# Patient Record
Sex: Male | Born: 1955 | Race: White | Hispanic: No | Smoking: Current some day smoker
Health system: Southern US, Community
[De-identification: ages and names within clinical notes are randomized; demographics above are authoritative.]

## PROBLEM LIST (undated history)

## (undated) DIAGNOSIS — I10 Essential (primary) hypertension: Secondary | ICD-10-CM

## (undated) DIAGNOSIS — G4730 Sleep apnea, unspecified: Secondary | ICD-10-CM

## (undated) DIAGNOSIS — J302 Other seasonal allergic rhinitis: Secondary | ICD-10-CM

## (undated) DIAGNOSIS — F419 Anxiety disorder, unspecified: Secondary | ICD-10-CM

## (undated) DIAGNOSIS — S129XXA Fracture of neck, unspecified, initial encounter: Secondary | ICD-10-CM

## (undated) DIAGNOSIS — M4800 Spinal stenosis, site unspecified: Secondary | ICD-10-CM

## (undated) DIAGNOSIS — F329 Major depressive disorder, single episode, unspecified: Secondary | ICD-10-CM

## (undated) DIAGNOSIS — T7840XA Allergy, unspecified, initial encounter: Secondary | ICD-10-CM

## (undated) DIAGNOSIS — E7800 Pure hypercholesterolemia, unspecified: Secondary | ICD-10-CM

## (undated) DIAGNOSIS — M542 Cervicalgia: Secondary | ICD-10-CM

## (undated) DIAGNOSIS — Z8639 Personal history of other endocrine, nutritional and metabolic disease: Secondary | ICD-10-CM

## (undated) DIAGNOSIS — F32A Depression, unspecified: Secondary | ICD-10-CM

## (undated) DIAGNOSIS — J45909 Unspecified asthma, uncomplicated: Secondary | ICD-10-CM

## (undated) DIAGNOSIS — M1990 Unspecified osteoarthritis, unspecified site: Secondary | ICD-10-CM

## (undated) DIAGNOSIS — M549 Dorsalgia, unspecified: Secondary | ICD-10-CM

## (undated) HISTORY — DX: Spinal stenosis, site unspecified: M48.00

## (undated) HISTORY — DX: Major depressive disorder, single episode, unspecified: F32.9

## (undated) HISTORY — DX: Allergy, unspecified, initial encounter: T78.40XA

## (undated) HISTORY — PX: NASAL SINUS SURGERY: SHX719

## (undated) HISTORY — DX: Anxiety disorder, unspecified: F41.9

## (undated) HISTORY — DX: Other seasonal allergic rhinitis: J30.2

## (undated) HISTORY — DX: Essential (primary) hypertension: I10

## (undated) HISTORY — PX: CARPAL TUNNEL RELEASE: SHX101

## (undated) HISTORY — DX: Depression, unspecified: F32.A

## (undated) HISTORY — DX: Unspecified osteoarthritis, unspecified site: M19.90

## (undated) HISTORY — PX: TONSILLECTOMY: SUR1361

## (undated) HISTORY — DX: Personal history of other endocrine, nutritional and metabolic disease: Z86.39

## (undated) HISTORY — DX: Unspecified asthma, uncomplicated: J45.909

## (undated) HISTORY — DX: Sleep apnea, unspecified: G47.30

---

## 2015-03-29 ENCOUNTER — Encounter (HOSPITAL_COMMUNITY): Payer: Self-pay

## 2015-03-29 ENCOUNTER — Emergency Department (HOSPITAL_COMMUNITY)
Admission: EM | Admit: 2015-03-29 | Discharge: 2015-03-29 | Disposition: A | Discharge status: Home / Self Care | Payer: Medicare (Managed Care) | Attending: Emergency Medicine | Admitting: Emergency Medicine

## 2015-03-29 DIAGNOSIS — D1721 Benign lipomatous neoplasm of skin and subcutaneous tissue of right arm: Secondary | ICD-10-CM | POA: Diagnosis not present

## 2015-03-29 DIAGNOSIS — D1720 Benign lipomatous neoplasm of skin and subcutaneous tissue of unspecified limb: Secondary | ICD-10-CM

## 2015-03-29 DIAGNOSIS — Z8781 Personal history of (healed) traumatic fracture: Secondary | ICD-10-CM | POA: Diagnosis not present

## 2015-03-29 DIAGNOSIS — R200 Anesthesia of skin: Secondary | ICD-10-CM | POA: Insufficient documentation

## 2015-03-29 DIAGNOSIS — I10 Essential (primary) hypertension: Secondary | ICD-10-CM | POA: Diagnosis not present

## 2015-03-29 DIAGNOSIS — Z720 Tobacco use: Secondary | ICD-10-CM | POA: Insufficient documentation

## 2015-03-29 DIAGNOSIS — E780 Pure hypercholesterolemia: Secondary | ICD-10-CM | POA: Diagnosis not present

## 2015-03-29 DIAGNOSIS — G8929 Other chronic pain: Secondary | ICD-10-CM | POA: Diagnosis not present

## 2015-03-29 DIAGNOSIS — Z79899 Other long term (current) drug therapy: Secondary | ICD-10-CM | POA: Insufficient documentation

## 2015-03-29 DIAGNOSIS — R2231 Localized swelling, mass and lump, right upper limb: Secondary | ICD-10-CM | POA: Diagnosis present

## 2015-03-29 HISTORY — DX: Pure hypercholesterolemia, unspecified: E78.00

## 2015-03-29 HISTORY — DX: Fracture of neck, unspecified, initial encounter: S12.9XXA

## 2015-03-29 NOTE — Discharge Instructions (Signed)
Lipoma  A lipoma is a noncancerous (benign) tumor composed of fat cells. They are usually found under the skin (subcutaneous). A lipoma may occur in any tissue of the body that contains fat. Common areas for lipomas to appear include the back, shoulders, buttocks, and thighs. Lipomas are a very common soft tissue growth. They are soft and grow slowly. Most problems caused by a lipoma depend on where it is growing.  DIAGNOSIS   A lipoma can be diagnosed with a physical exam. These tumors rarely become cancerous, but radiographic studies can help determine this for certain. Studies used may include:  · Computerized X-ray scans (CT or CAT scan).  · Computerized magnetic scans (MRI).  TREATMENT   Small lipomas that are not causing problems may be watched. If a lipoma continues to enlarge or causes problems, removal is often the best treatment. Lipomas can also be removed to improve appearance. Surgery is done to remove the fatty cells and the surrounding capsule. Most often, this is done with medicine that numbs the area (local anesthetic). The removed tissue is examined under a microscope to make sure it is not cancerous. Keep all follow-up appointments with your caregiver.  SEEK MEDICAL CARE IF:   · The lipoma becomes larger or hard.  · The lipoma becomes painful, red, or increasingly swollen. These could be signs of infection or a more serious condition.  Document Released: 06/07/2002 Document Revised: 09/09/2011 Document Reviewed: 11/17/2009  ExitCare® Patient Information ©2015 [employer]. This information is not intended to replace advice given to you by your health care provider. Make sure you discuss any questions you have with your health care provider.

## 2015-03-29 NOTE — ED Notes (Signed)
Pt reports noticed a knot on upper r arm x 6 months.  Denies injury.  Reports his pcp told him not to worry about it if it isn't hurting.  Pt says since then has progressively gotten worse and has had some numbness in r arm.

## 2015-03-30 NOTE — ED Provider Notes (Signed)
CSN: [id number]     Arrival date & time 03/29/15  1149 History   First MD Initiated Contact with Patient 03/29/15 1257     Chief Complaint  Patient presents with  . knot on r arm      (Consider location/radiation/quality/duration/timing/severity/associated sxs/prior Treatment) The history is provided by the patient.   David Simon is a 59 y.o. 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  He endorses waking some morning with pain and numbness in the right arm, especially when he wakes on that side, with symptoms resolving after a few minute of moving.  The nodule has enlarged and is now sore with palpation.  His pcp prior to moving to this area diagnosed him with a lipoma.  Pt has concerns about the progression of size and pain since [place]  He denies any other nodules and has had no changes in his chronic neck pain and denies any other symptoms at this time, endorsing good appetite, no unexplained weight loss or other new pain, nodules or swelling.  He has had no treatment for this problem.     Past Medical History  Diagnosis Date  . Hypertension   . Hypercholesterolemia   . Fracture of neck     traumatic event in 2008   Past Surgical History  Procedure Laterality Date  . Carpal tunnel release     No family history on file. Social History  Substance Use Topics  . Smoking status: Current Some Day Smoker    Types: Cigars  . Smokeless tobacco: None  . Alcohol Use: Yes     Comment: 2-3beers per day    Review of Systems  Constitutional: Negative for fever and chills.  HENT: Negative.   Respiratory: Negative for shortness of breath.   Cardiovascular: Negative.   Gastrointestinal: Negative.   Musculoskeletal: Negative for  arthralgias.  Skin: Negative for color change and rash.  Neurological: Negative for weakness and headaches.      Allergies  Other  Home Medications   Prior to Admission medications   Medication Sig Start Date End Date Taking? Authorizing Carmeron Heady  amLODipine-valsartan (EXFORGE) 10-320 MG tablet Take 1 tablet by mouth daily.   Yes Historical Lynleigh Kovack, MD  atorvastatin (LIPITOR) 10 MG tablet Take 10 mg by mouth daily.   Yes Historical Mccall Will, MD  levocetirizine (XYZAL) 5 MG tablet Take 5 mg by mouth every evening.   Yes Historical Dennies Coate, MD  metoprolol (LOPRESSOR) 100 MG tablet Take 100 mg by mouth 2 (two) times daily.   Yes Historical Viki Carrera, MD   BP 148/88 mmHg  Pulse 62  Temp(Src) 98.2 F (36.8 C) (Oral)  Resp 18  Ht 5\' 10"  (1.778 m)  Wt 207 lb (93.895 kg)  BMI 29.70 kg/m2  SpO2 99% Physical Exam  Constitutional: He appears well-developed and well-nourished. No distress.  HENT:  Head: Normocephalic.  Neck: Neck supple.  Cardiovascular: Normal rate.   Pulmonary/Chest: Effort normal. He has no wheezes.  Musculoskeletal: Normal range of motion. He exhibits no edema or tenderness.  Lymphadenopathy:    He has no cervical adenopathy.       Right axillary: No pectoral and no lateral adenopathy present.       Right: No supraclavicular and no  epitrochlear adenopathy present.  Neurological: He exhibits normal muscle tone.  Equal grip strength. Normal sensation in upper extremities, radial pulses full.  Skin:  Approximate 2 cm mobile, soft, rubbery feeling nodule right upper lateral arm.  No erythema or skin changes, skin is intact.      ED Course  Procedures (including critical care time) Labs Review Labs Reviewed - No data to display  Imaging Review No results found. I have personally reviewed and evaluated these images and lab results as part of my medical decision-making.   EKG Interpretation None      MDM   Final diagnoses:  Lipoma of arm    Pt was  also seen by Dr. Reather Converse for pt reassurance.  He was referred to dermatology in the event this lipoma continues to worsen or becomes more problematic. He may benefit from biopsy/excision since it is starting to cause sx for him.  Pt understands and agrees with plan. No clinical evidence for cancerous tumor, infection, abscess.  Most consistent with benign lipoma.    Evalee Jefferson, PA-C 03/30/15 2080  Elnora Morrison, MD 03/30/15 [phone]

## 2015-05-02 ENCOUNTER — Encounter (INDEPENDENT_AMBULATORY_CARE_PROVIDER_SITE_OTHER): Payer: Self-pay

## 2015-05-02 ENCOUNTER — Encounter: Payer: Self-pay | Admitting: Family Medicine

## 2015-05-02 ENCOUNTER — Ambulatory Visit (INDEPENDENT_AMBULATORY_CARE_PROVIDER_SITE_OTHER): Payer: Medicare Other | Admitting: Family Medicine

## 2015-05-02 VITALS — BP 120/70 | HR 79 | Temp 97.1°F | Ht 70.0 in | Wt 202.0 lb

## 2015-05-02 DIAGNOSIS — N529 Male erectile dysfunction, unspecified: Secondary | ICD-10-CM | POA: Diagnosis not present

## 2015-05-02 DIAGNOSIS — N400 Enlarged prostate without lower urinary tract symptoms: Secondary | ICD-10-CM

## 2015-05-02 DIAGNOSIS — E785 Hyperlipidemia, unspecified: Secondary | ICD-10-CM

## 2015-05-02 DIAGNOSIS — I10 Essential (primary) hypertension: Secondary | ICD-10-CM | POA: Diagnosis not present

## 2015-05-02 DIAGNOSIS — J0100 Acute maxillary sinusitis, unspecified: Secondary | ICD-10-CM

## 2015-05-02 MED ORDER — ATORVASTATIN CALCIUM 10 MG PO TABS: 10.0000 mg | ORAL_TABLET | Freq: Every day | ORAL | Status: DC

## 2015-05-02 MED ORDER — PREDNISONE 20 MG PO TABS: 40.0000 mg | ORAL_TABLET | Freq: Every day | ORAL | Status: DC

## 2015-05-02 MED ORDER — METOPROLOL TARTRATE 100 MG PO TABS: 100.0000 mg | ORAL_TABLET | Freq: Two times a day (BID) | ORAL | Status: DC

## 2015-05-02 MED ORDER — NAPROXEN 375 MG PO TABS: 375.0000 mg | ORAL_TABLET | Freq: Two times a day (BID) | ORAL | Status: DC

## 2015-05-02 MED ORDER — DOXYCYCLINE HYCLATE 100 MG PO TABS: 100.0000 mg | ORAL_TABLET | Freq: Two times a day (BID) | ORAL | Status: DC

## 2015-05-02 MED ORDER — LISINOPRIL-HYDROCHLOROTHIAZIDE 20-25 MG PO TABS: 1.0000 | ORAL_TABLET | Freq: Every day | ORAL | Status: DC

## 2015-05-02 MED ORDER — LEVOCETIRIZINE DIHYDROCHLORIDE 5 MG PO TABS: 5.0000 mg | ORAL_TABLET | Freq: Every evening | ORAL | Status: DC

## 2015-05-02 NOTE — Patient Instructions (Signed)
Great to meet you!  I have treated you for a sinus infection today with doxycycline an dprednisone  If you encounter any problems with medicines, please call  Come back in 3-4 weeks for a physical.   Sinusitis, Adult Sinusitis is redness, soreness, and inflammation of the paranasal sinuses. Paranasal sinuses are air pockets within the bones of your face. They are located beneath your eyes, in the middle of your forehead, and above your eyes. In healthy paranasal sinuses, mucus is able to drain out, and air is able to circulate through them by way of your nose. However, when your paranasal sinuses are inflamed, mucus and air can become trapped. This can allow bacteria and other germs to grow and cause infection. Sinusitis can develop quickly and last only a short time (acute) or continue over a long period (chronic). Sinusitis that lasts for more than 12 weeks is considered chronic. CAUSES Causes of sinusitis include:  Allergies.  Structural abnormalities, such as displacement of the cartilage that separates your nostrils (deviated septum), which can decrease the air flow through your nose and sinuses and affect sinus drainage.  Functional abnormalities, such as when the small hairs (cilia) that line your sinuses and help remove mucus do not work properly or are not present. SIGNS AND SYMPTOMS Symptoms of acute and chronic sinusitis are the same. The primary symptoms are pain and pressure around the affected sinuses. Other symptoms include:  Upper toothache.  Earache.  Headache.  Bad breath.  Decreased sense of smell and taste.  A cough, which worsens when you are lying flat.  Fatigue.  Fever.  Thick drainage from your nose, which often is green and may contain pus (purulent).  Swelling and warmth over the affected sinuses. DIAGNOSIS Your health care provider will perform a physical exam. During your exam, your health care provider may perform any of the following to help  determine if you have acute sinusitis or chronic sinusitis:  Look in your nose for signs of abnormal growths in your nostrils (nasal polyps).  Tap over the affected sinus to check for signs of infection.  View the inside of your sinuses using an imaging device that has a light attached (endoscope). If your health care provider suspects that you have chronic sinusitis, one or more of the following tests may be recommended:  Allergy tests.  Nasal culture. A sample of mucus is taken from your nose, sent to a lab, and screened for bacteria.  Nasal cytology. A sample of mucus is taken from your nose and examined by your health care provider to determine if your sinusitis is related to an allergy. TREATMENT Most cases of acute sinusitis are related to a viral infection and will resolve on their own within 10 days. Sometimes, medicines are prescribed to help relieve symptoms of both acute and chronic sinusitis. These may include pain medicines, decongestants, nasal steroid sprays, or saline sprays. However, for sinusitis related to a bacterial infection, your health care provider will prescribe antibiotic medicines. These are medicines that will help kill the bacteria causing the infection. Rarely, sinusitis is caused by a fungal infection. In these cases, your health care provider will prescribe antifungal medicine. For some cases of chronic sinusitis, surgery is needed. Generally, these are cases in which sinusitis recurs more than 3 times per year, despite other treatments. HOME CARE INSTRUCTIONS  Drink plenty of water. Water helps thin the mucus so your sinuses can drain more easily.  Use a humidifier.  Inhale steam 3-4 times a  day (for example, sit in the bathroom with the shower running).  Apply a warm, moist washcloth to your face 3-4 times a day, or as directed by your health care provider.  Use saline nasal sprays to help moisten and clean your sinuses.  Take medicines only as  directed by your health care provider.  If you were prescribed either an antibiotic or antifungal medicine, finish it all even if you start to feel better. SEEK IMMEDIATE MEDICAL CARE IF:  You have increasing pain or severe headaches.  You have nausea, vomiting, or drowsiness.  You have swelling around your face.  You have vision problems.  You have a stiff neck.  You have difficulty breathing.   This information is not intended to replace advice given to you by your health care provider. Make sure you discuss any questions you have with your health care provider.   Document Released: 06/17/2005 Document Revised: 07/08/2014 Document Reviewed: 07/02/2011 Elsevier Interactive Patient Education [employer].

## 2015-05-02 NOTE — Progress Notes (Signed)
   HPI  Patient presents today to establish care and for acute illness.  Patient Reason cough, nasal congestion, ear pain bilaterally, facial pain, and malaise for about 2-1/2 weeks. He took a half course of amoxicillin and improve slightly but then worsened again. He also endorses dyspnea, mild.  He's moved from [place] about 3 months ago and needs refills on several medications. He would like to change his blood pressure medicine due to cost.  Takes Cialis for BPH and erectile dysfunction He has several running medications which he is not attached to but states that they were covered well under his insurance.  PMH: Smoking status noted Past medical, surgical, social, family history reviewed and updated in EMR ROS: Per HPI  Objective: BP 120/70 mmHg  Pulse 79  Temp(Src) 97.1 F (36.2 C) (Oral)  Ht 5\' 10"  (1.778 m)  Wt 202 lb (91.627 kg)  BMI 28.98 kg/m2 Gen: NAD, alert, cooperative with exam HEENT: NCAT, nares clear, TMs normal bilaterally CV: RRR, good S1/S2, no murmur Resp: CTABL, no wheezes, non-labored Ext: No edema, warm Neuro: Alert and oriented, No gross deficits  Assessment and plan:  # Acute maxillary sinusitis Considering that he has used a recent partial course of amoxicillin I will avoid Augmentin for now. Treat with doxycycline, prednisone  # Hypertension Change Exforge to Prinzide BMP today Recommended fasting labs at his physical in one month.   Orders Placed This Encounter  Procedures  . Basic Metabolic Panel    Meds ordered this encounter  Medications  . tadalafil (CIALIS) 5 MG tablet    Sig: Take 5 mg by mouth daily as needed for erectile dysfunction.  . Cholecalciferol (VITAMIN D3) 50000 UNITS CAPS    Sig: Take by mouth.  . DISCONTD: meloxicam (MOBIC) 15 MG tablet    Sig: Take 15 mg by mouth daily.  . metoprolol (LOPRESSOR) 100 MG tablet    Sig: Take 1 tablet (100 mg total) by mouth 2 (two) times daily.    Dispense:  60 tablet   Refill:  2  . naproxen (NAPROSYN) 375 MG tablet    Sig: Take 1 tablet (375 mg total) by mouth 2 (two) times daily with a meal.    Dispense:  60 tablet    Refill:  1  . levocetirizine (XYZAL) 5 MG tablet    Sig: Take 1 tablet (5 mg total) by mouth every evening.    Dispense:  30 tablet    Refill:  3  . atorvastatin (LIPITOR) 10 MG tablet    Sig: Take 1 tablet (10 mg total) by mouth daily.    Dispense:  90 tablet    Refill:  3  . lisinopril-hydrochlorothiazide (PRINZIDE,ZESTORETIC) 20-25 MG tablet    Sig: Take 1 tablet by mouth daily.    Dispense:  90 tablet    Refill:  3  . predniSONE (DELTASONE) 20 MG tablet    Sig: Take 2 tablets (40 mg total) by mouth daily with breakfast.    Dispense:  14 tablet    Refill:  0    David Apple, MD [facility] 05/02/2015, 4:14 PM

## 2015-05-03 LAB — BASIC METABOLIC PANEL
BUN/Creatinine Ratio: 23 — ABNORMAL HIGH (ref 9–20)
BUN: 22 mg/dL (ref 6–24)
CALCIUM: 9.5 mg/dL (ref 8.7–10.2)
CHLORIDE: 100 mmol/L (ref 97–106)
CO2: 20 mmol/L (ref 18–29)
CREATININE: 0.96 mg/dL (ref 0.76–1.27)
GFR, EST AFRICAN AMERICAN: 100 mL/min/{1.73_m2} (ref >59)
GFR, EST NON AFRICAN AMERICAN: 86 mL/min/{1.73_m2} (ref >59)
Glucose: 81 mg/dL (ref 65–99)
POTASSIUM: 3.8 mmol/L (ref 3.5–5.2)
Sodium: 140 mmol/L (ref 136–144)

## 2015-05-04 DIAGNOSIS — D1721 Benign lipomatous neoplasm of skin and subcutaneous tissue of right arm: Secondary | ICD-10-CM | POA: Diagnosis not present

## 2015-05-10 ENCOUNTER — Telehealth: Payer: Self-pay | Admitting: Family Medicine

## 2015-05-10 NOTE — Telephone Encounter (Signed)
Left detailed message stating the generic for that particular medication and we don't have any samples but to CB with any further questions or concerns.

## 2015-05-17 ENCOUNTER — Other Ambulatory Visit: Payer: Self-pay | Admitting: Family Medicine

## 2015-05-18 MED ORDER — VENLAFAXINE HCL 75 MG PO TABS: 75.0000 mg | ORAL_TABLET | Freq: Two times a day (BID) | ORAL | Status: DC

## 2015-05-18 NOTE — Addendum Note (Signed)
Addended by: Marylin Crosby on: 05/18/2015 10:02 AM   Modules accepted: Orders

## 2015-05-18 NOTE — Telephone Encounter (Signed)
Stp and he agrees to try effexor, rx sent to pharmacy.

## 2015-05-18 NOTE — Telephone Encounter (Signed)
I have changed exforge to prinzide for cost (on 4 dollar list) and have sent that in 11/1. He should stop exforge and take prinzide instead (lisinopril/HCTZ)  Cymbalta could be changed to effexor  But I dont see this on his list.   Will ask nursing to confirm cymbalta and recommend effexor 75 BID instead.   Laroy Apple, MD [facility] 05/18/2015, 7:42 AM

## 2015-05-22 ENCOUNTER — Telehealth: Payer: Self-pay | Admitting: Family Medicine

## 2015-05-22 NOTE — Telephone Encounter (Signed)
Recent RX for BP med Pt just wanted to make sure he could take this with Metoprolol   He was on exforge and metoprolol Aware that meds are correct and to monitor BP more closely since the recent change.

## 2015-05-29 ENCOUNTER — Telehealth: Payer: Self-pay | Admitting: Family Medicine

## 2015-05-29 NOTE — Telephone Encounter (Signed)
Patient states that he is having spasms in his leg since starting the atorvastatin. Please advise.

## 2015-06-07 ENCOUNTER — Ambulatory Visit (INDEPENDENT_AMBULATORY_CARE_PROVIDER_SITE_OTHER): Payer: Medicare Other | Admitting: Family Medicine

## 2015-06-07 ENCOUNTER — Encounter: Payer: Self-pay | Admitting: Family Medicine

## 2015-06-07 VITALS — BP 139/88 | HR 66 | Temp 96.7°F | Ht 70.0 in | Wt 210.0 lb

## 2015-06-07 DIAGNOSIS — J019 Acute sinusitis, unspecified: Secondary | ICD-10-CM | POA: Diagnosis not present

## 2015-06-07 DIAGNOSIS — I10 Essential (primary) hypertension: Secondary | ICD-10-CM

## 2015-06-07 DIAGNOSIS — Z1211 Encounter for screening for malignant neoplasm of colon: Secondary | ICD-10-CM

## 2015-06-07 DIAGNOSIS — E785 Hyperlipidemia, unspecified: Secondary | ICD-10-CM

## 2015-06-07 DIAGNOSIS — J0100 Acute maxillary sinusitis, unspecified: Secondary | ICD-10-CM | POA: Diagnosis not present

## 2015-06-07 DIAGNOSIS — N400 Enlarged prostate without lower urinary tract symptoms: Secondary | ICD-10-CM

## 2015-06-07 DIAGNOSIS — Z0000 Encounter for general adult medical examination without abnormal findings: Secondary | ICD-10-CM | POA: Diagnosis not present

## 2015-06-07 MED ORDER — TRIAMCINOLONE ACETONIDE 40 MG/ML IJ SUSP
40.0000 mg | Freq: Once | INTRAMUSCULAR | Status: AC
  Administered 2015-06-07: 40 mg via INTRAMUSCULAR

## 2015-06-07 MED ORDER — AZITHROMYCIN 250 MG PO TABS: ORAL_TABLET | ORAL | Status: DC

## 2015-06-07 MED ORDER — ROSUVASTATIN CALCIUM 5 MG PO TABS: 5.0000 mg | ORAL_TABLET | Freq: Every day | ORAL | Status: DC

## 2015-06-07 MED ORDER — SILDENAFIL CITRATE 20 MG PO TABS: ORAL_TABLET | ORAL | Status: DC

## 2015-06-07 MED ORDER — TADALAFIL 5 MG PO TABS: 5.0000 mg | ORAL_TABLET | Freq: Every day | ORAL | Status: DC | PRN

## 2015-06-07 MED ORDER — METOPROLOL TARTRATE 100 MG PO TABS: 100.0000 mg | ORAL_TABLET | Freq: Two times a day (BID) | ORAL | Status: DC

## 2015-06-07 NOTE — Progress Notes (Signed)
   HPI  Patient presents today here for an annual physical and to discuss a few medical problems.  Hypertension Doing well, tolerating meds easily. He's out of metoprolol No chest pain, and dyspnea, palpitations, or leg edema.  Acute illness 7-9 days of bilateral sinus pressure, cough, malaise, subjective fever, and sore throat. Tolerating food and fluid easily. No dyspnea No chest pain.  Hyperlipidemia He took Lipitor and developed myalgias, he stopped for a few weeks and then tried again causing myalgias again Fasting today.  BPH Has difficulty with weak stream, urinary frequency Previously did well with Cialis which also helped his ED. Cialis is not covered by his insurance and cost about $300 a month  PMH: Smoking status noted His past medical, surgical, social, family history reviewed and updated in EMR ROS: Per HPI  Objective: BP 139/88 mmHg  Pulse 66  Temp(Src) 96.7 F (35.9 C) (Oral)  Ht _0  (1.778 m)  Wt 210 lb (95.255 kg)  BMI 30.13 kg/m2 Gen: NAD, alert, cooperative with exam HEENT: NCAT, nares clear, TMs normal bilaterally, tonsils surgically absent, MMM, bilateral maxillary sinus pressure to palpation CV: RRR, good S1/S2, no murmur Resp: CTABL, no wheezes, non-labored Ext: No edema, warm Neuro: Alert and oriented, No gross deficits  Assessment and plan:  # Acute sinusitis Recurrent episode, previously improved completely with antibiotics and steroids. He requests IM Kenalog and today, we discussed the dangers of frequent steroids Given 7-9 days of symptoms and facial pain and I think it is prudent to go ahead and treat for acute sinusitis Previously given doxycycline, using azithromycin this time Continue Flonase or neti pot  # Hypertension Reasonably well controlled, he is off of metoprolol and still controlled today No red flags Continue current meds, Prinzide and metoprolol  # Healthcare maintenance Fecal occult blood test negative  today Fasting labs  # Hyperlipidemia Cannot tolerate Lipitor, trying Crestor  #BPH He declines medications for this today, DRE is reassuring  #Erectile dysfunction Given 20 mg sildenafil due to cost, also given Cialis samples    Orders Placed This Encounter  Procedures  . PSA  . CBC with Differential  . CMP14+EGFR  . Lipid Panel    Meds ordered this encounter  Medications  . tadalafil (CIALIS) 5 MG tablet    Sig: Take 1 tablet (5 mg total) by mouth daily as needed for erectile dysfunction.    Dispense:  10 tablet    Refill:  0  . sildenafil (REVATIO) 20 MG tablet    Sig: Take 2 to 5 pills once daily for erectile dysfunction    Dispense:  30 tablet    Refill:  3  . metoprolol (LOPRESSOR) 100 MG tablet    Sig: Take 1 tablet (100 mg total) by mouth 2 (two) times daily.    Dispense:  60 tablet    Refill:  2  . rosuvastatin (CRESTOR) 5 MG tablet    Sig: Take 1 tablet (5 mg total) by mouth daily.    Dispense:  90 tablet    Refill:  3  . azithromycin (ZITHROMAX Z-PAK) 250 MG tablet    Sig: 2 pills on day 1, 1 pill daily after that    Dispense:  6 tablet    Refill:  0  . triamcinolone acetonide (KENALOG-40) injection 40 mg    Sig:     Laroy Apple, MD [facility] 06/07/2015, 12:07 PM

## 2015-06-07 NOTE — Patient Instructions (Addendum)
Great to see you!  Lets follow up in 6 months  Sinusitis, Adult Sinusitis is redness, soreness, and inflammation of the paranasal sinuses. Paranasal sinuses are air pockets within the bones of your face. They are located beneath your eyes, in the middle of your forehead, and above your eyes. In healthy paranasal sinuses, mucus is able to drain out, and air is able to circulate through them by way of your nose. However, when your paranasal sinuses are inflamed, mucus and air can become trapped. This can allow bacteria and other germs to grow and cause infection. Sinusitis can develop quickly and last only a short time (acute) or continue over a long period (chronic). Sinusitis that lasts for more than 12 weeks is considered chronic. CAUSES Causes of sinusitis include:  Allergies.  Structural abnormalities, such as displacement of the cartilage that separates your nostrils (deviated septum), which can decrease the air flow through your nose and sinuses and affect sinus drainage.  Functional abnormalities, such as when the small hairs (cilia) that line your sinuses and help remove mucus do not work properly or are not present. SIGNS AND SYMPTOMS Symptoms of acute and chronic sinusitis are the same. The primary symptoms are pain and pressure around the affected sinuses. Other symptoms include:  Upper toothache.  Earache.  Headache.  Bad breath.  Decreased sense of smell and taste.  A cough, which worsens when you are lying flat.  Fatigue.  Fever.  Thick drainage from your nose, which often is green and may contain pus (purulent).  Swelling and warmth over the affected sinuses. DIAGNOSIS Your health care provider will perform a physical exam. During your exam, your health care provider may perform any of the following to help determine if you have acute sinusitis or chronic sinusitis:  Look in your nose for signs of abnormal growths in your nostrils (nasal polyps).  Tap over the  affected sinus to check for signs of infection.  View the inside of your sinuses using an imaging device that has a light attached (endoscope). If your health care provider suspects that you have chronic sinusitis, one or more of the following tests may be recommended:  Allergy tests.  Nasal culture. A sample of mucus is taken from your nose, sent to a lab, and screened for bacteria.  Nasal cytology. A sample of mucus is taken from your nose and examined by your health care provider to determine if your sinusitis is related to an allergy. TREATMENT Most cases of acute sinusitis are related to a viral infection and will resolve on their own within 10 days. Sometimes, medicines are prescribed to help relieve symptoms of both acute and chronic sinusitis. These may include pain medicines, decongestants, nasal steroid sprays, or saline sprays. However, for sinusitis related to a bacterial infection, your health care provider will prescribe antibiotic medicines. These are medicines that will help kill the bacteria causing the infection. Rarely, sinusitis is caused by a fungal infection. In these cases, your health care provider will prescribe antifungal medicine. For some cases of chronic sinusitis, surgery is needed. Generally, these are cases in which sinusitis recurs more than 3 times per year, despite other treatments. HOME CARE INSTRUCTIONS  Drink plenty of water. Water helps thin the mucus so your sinuses can drain more easily.  Use a humidifier.  Inhale steam 3-4 times a day (for example, sit in the bathroom with the shower running).  Apply a warm, moist washcloth to your face 3-4 times a day, or as directed  by your health care provider.  Use saline nasal sprays to help moisten and clean your sinuses.  Take medicines only as directed by your health care provider.  If you were prescribed either an antibiotic or antifungal medicine, finish it all even if you start to feel better. SEEK  IMMEDIATE MEDICAL CARE IF:  You have increasing pain or severe headaches.  You have nausea, vomiting, or drowsiness.  You have swelling around your face.  You have vision problems.  You have a stiff neck.  You have difficulty breathing.   This information is not intended to replace advice given to you by your health care provider. Make sure you discuss any questions you have with your health care provider.   Document Released: 06/17/2005 Document Revised: 07/08/2014 Document Reviewed: 07/02/2011 Elsevier Interactive Patient Education [employer].

## 2015-06-08 ENCOUNTER — Other Ambulatory Visit: Payer: Self-pay | Admitting: *Deleted

## 2015-06-08 ENCOUNTER — Telehealth: Payer: Self-pay | Admitting: Family Medicine

## 2015-06-08 DIAGNOSIS — Z1211 Encounter for screening for malignant neoplasm of colon: Secondary | ICD-10-CM | POA: Insufficient documentation

## 2015-06-08 LAB — LIPID PANEL
CHOL/HDL RATIO: 3.8 ratio (ref 0.0–5.0)
Cholesterol, Total: 194 mg/dL (ref 100–199)
HDL: 51 mg/dL (ref >39)
LDL CALC: 109 mg/dL — AB (ref 0–99)
Triglycerides: 172 mg/dL — ABNORMAL HIGH (ref 0–149)
VLDL CHOLESTEROL CAL: 34 mg/dL (ref 5–40)

## 2015-06-08 LAB — CMP14+EGFR
ALBUMIN: 4.2 g/dL (ref 3.5–5.5)
ALT: 13 IU/L (ref 0–44)
AST: 26 IU/L (ref 0–40)
Albumin/Globulin Ratio: 2.1 (ref 1.1–2.5)
Alkaline Phosphatase: 56 IU/L (ref 39–117)
BUN/Creatinine Ratio: 20 (ref 9–20)
BUN: 18 mg/dL (ref 6–24)
Bilirubin Total: 0.2 mg/dL (ref 0.0–1.2)
CALCIUM: 9.5 mg/dL (ref 8.7–10.2)
CHLORIDE: 97 mmol/L (ref 97–106)
CO2: 23 mmol/L (ref 18–29)
CREATININE: 0.9 mg/dL (ref 0.76–1.27)
GFR, EST AFRICAN AMERICAN: 108 mL/min/{1.73_m2} (ref >59)
GFR, EST NON AFRICAN AMERICAN: 93 mL/min/{1.73_m2} (ref >59)
GLUCOSE: 91 mg/dL (ref 65–99)
Globulin, Total: 2 g/dL (ref 1.5–4.5)
Potassium: 4.2 mmol/L (ref 3.5–5.2)
Sodium: 136 mmol/L (ref 136–144)
TOTAL PROTEIN: 6.2 g/dL (ref 6.0–8.5)

## 2015-06-08 LAB — HEMOCCULT GUIAC POC 1CARD (OFFICE): Fecal Occult Blood, POC: NEGATIVE

## 2015-06-08 LAB — CBC WITH DIFFERENTIAL/PLATELET
BASOS ABS: 0.1 10*3/uL (ref 0.0–0.2)
Basos: 1 %
EOS (ABSOLUTE): 0.1 10*3/uL (ref 0.0–0.4)
Eos: 2 %
HEMOGLOBIN: 13.4 g/dL (ref 12.6–17.7)
Hematocrit: 38.9 % (ref 37.5–51.0)
IMMATURE GRANS (ABS): 0 10*3/uL (ref 0.0–0.1)
IMMATURE GRANULOCYTES: 1 %
LYMPHS: 29 %
Lymphocytes Absolute: 1.8 10*3/uL (ref 0.7–3.1)
MCH: 30.4 pg (ref 26.6–33.0)
MCHC: 34.4 g/dL (ref 31.5–35.7)
MCV: 88 fL (ref 79–97)
MONOCYTES: 11 %
Monocytes Absolute: 0.7 10*3/uL (ref 0.1–0.9)
NEUTROS PCT: 56 %
Neutrophils Absolute: 3.6 10*3/uL (ref 1.4–7.0)
PLATELETS: 282 10*3/uL (ref 150–379)
RBC: 4.41 x10E6/uL (ref 4.14–5.80)
RDW: 13.3 % (ref 12.3–15.4)
WBC: 6.2 10*3/uL (ref 3.4–10.8)

## 2015-06-08 LAB — IFOBT (OCCULT BLOOD): IMMUNOLOGICAL FECAL OCCULT BLOOD TEST: NEGATIVE

## 2015-06-08 LAB — PSA: PROSTATE SPECIFIC AG, SERUM: 0.9 ng/mL (ref 0.0–4.0)

## 2015-06-08 NOTE — Telephone Encounter (Signed)
Reviewed results. 

## 2015-06-08 NOTE — Addendum Note (Signed)
Addended by: Ilean China on: 06/08/2015 02:25 PM   Modules accepted: Orders

## 2015-06-08 NOTE — Addendum Note (Signed)
Addended by: Timmothy Euler on: 06/08/2015 02:04 PM   Modules accepted: Orders, SmartSet

## 2015-06-12 ENCOUNTER — Other Ambulatory Visit: Payer: Self-pay | Admitting: Family Medicine

## 2015-06-12 MED ORDER — DULOXETINE HCL 60 MG PO CPEP: 60.0000 mg | ORAL_CAPSULE | Freq: Every day | ORAL | Status: DC

## 2015-06-12 NOTE — Telephone Encounter (Signed)
Change effexor to [place], MD [facility] 06/12/2015, 3:08 PM

## 2015-07-20 ENCOUNTER — Telehealth: Payer: Self-pay | Admitting: Family Medicine

## 2015-07-20 NOTE — Telephone Encounter (Signed)
Any local therapist would be good. He can use [email or web address] to enter his insurance and area of choice for a list.   Laroy Apple, MD [facility] 07/20/2015, 5:25 PM

## 2015-07-21 NOTE — Telephone Encounter (Signed)
Patient aware and I also mailed him a list.

## 2015-07-24 ENCOUNTER — Ambulatory Visit (INDEPENDENT_AMBULATORY_CARE_PROVIDER_SITE_OTHER): Payer: Medicare Other | Admitting: Family Medicine

## 2015-07-24 ENCOUNTER — Ambulatory Visit (INDEPENDENT_AMBULATORY_CARE_PROVIDER_SITE_OTHER): Payer: Medicare Other

## 2015-07-24 ENCOUNTER — Encounter: Payer: Self-pay | Admitting: Family Medicine

## 2015-07-24 VITALS — BP 131/69 | HR 62 | Temp 97.0°F | Ht 70.0 in | Wt 210.6 lb

## 2015-07-24 DIAGNOSIS — J0101 Acute recurrent maxillary sinusitis: Secondary | ICD-10-CM | POA: Diagnosis not present

## 2015-07-24 DIAGNOSIS — M25511 Pain in right shoulder: Secondary | ICD-10-CM

## 2015-07-24 MED ORDER — TRIAMCINOLONE ACETONIDE 40 MG/ML IJ SUSP
40.0000 mg | Freq: Once | INTRAMUSCULAR | Status: AC
  Administered 2015-07-24: 40 mg via INTRAMUSCULAR

## 2015-07-24 MED ORDER — SILDENAFIL CITRATE 20 MG PO TABS: ORAL_TABLET | ORAL | Status: DC

## 2015-07-24 MED ORDER — MOMETASONE FUROATE 50 MCG/ACT NA SUSP: 2.0000 | Freq: Every day | NASAL | Status: DC

## 2015-07-24 NOTE — Progress Notes (Signed)
HPI  Patient presents today here for right shoulder pain, erectile dysfunction, and sinusitis.  Right shoulder pain Insidious onset over the last 1 month. No injury Has history of left-sided rotator cuff injury but no right shoulder injury. States that he's developed a popping when moving his arm in certain positions He also has right hand numbness intermittently but no loss of function or weakness.  He also has lumbar back pain, he shows me the MRI today which we will have scanned in. He is previously had lumbar epidural injections with with some good relief, he would like to have this done again.  Erectile dysfunction Doing well with 80 mg of sildenafil, would like a refill.  Sinusitis Bilateral maxillary sinus tenderness and pressure, with nasal congestion, and mild malaise over the last 7 days. No shortness of breath, cough, chills. Tolerating food and fluids easily.    PMH: Smoking status noted ROS: Per HPI  Objective: BP 131/69 mmHg  Pulse 62  Temp(Src) 97 F (36.1 C) (Oral)  Ht 5\' 10"  (1.778 m)  Wt 210 lb 9.6 oz (95.528 kg)  BMI 30.22 kg/m2 Gen: NAD, alert, cooperative with exam HEENT: NCAT, bilateral maxillary sinus tenderness to palpation, TMs normal bilaterally CV: RRR, good S1/S2, no murmur Resp: CTABL, no wheezes, non-labored Ext: No edema, warm Neuro: Alert and oriented, strength 5/5 in bilateral upper extremities  Musculoskeletal: Right shoulder with full range of motion except for limited range of motion with Apley scratch test Some crepitus with range of motion No tenderness to palpation of any landmarks. Hawkins and empty can test negative Strength 5/5 bilateral upper extremities  Neck: No bony or paraspinal muscle tenderness, full range of motion with no reproduction of symptoms   Shoulder x-ray No acute findings  Cervical spine x-ray Loss of disc space on C4 and C5 and spondylolisthesis at the same area  Assessment and plan:  #  Shoulder pain With radiculopathy and more suspicious of neck pathology X-ray today shows pathology around C4-C5 disc space  Referred orthopedic surgery, he will also benefit from epidural injections the lumbar spine  # Erectile Dysfunction Doing well with sildenafil 80 mg Refill  # Sinusitis Recent course of azithromycin He had good improvement with steroids about 6 weeks ago and request this again IM Kenalog given Give 2-3 days for symptoms resolve otherwise they'll gladly prescribe him antibiotics, consider Omnicef or Augmentin   Orders Placed This Encounter  Procedures  . DG Shoulder Right    Standing Status: Future     Number of Occurrences:      Standing Expiration Date: 09/20/2016    Order Specific Question:  Reason for Exam (SYMPTOM  OR DIAGNOSIS REQUIRED)    Answer:  r shoulder pain with radiculopathy    Order Specific Question:  Preferred imaging location?    Answer:  Internal  . DG Cervical Spine Complete    Standing Status: Future     Number of Occurrences:      Standing Expiration Date: 09/20/2016    Order Specific Question:  Reason for Exam (SYMPTOM  OR DIAGNOSIS REQUIRED)    Answer:  r shoulder pain with radiculopathy    Order Specific Question:  Preferred imaging location?    Answer:  Internal  . Ambulatory referral to Orthopedic Surgery    Referral Priority:  Routine    Referral Type:  Surgical    Referral Reason:  Specialty Services Required    Requested Specialty:  Orthopedic Surgery    Number of Visits Requested:  1    Meds ordered this encounter  Medications  . sildenafil (REVATIO) 20 MG tablet    Sig: Take 2 to 5 pills once daily for erectile dysfunction    Dispense:  30 tablet    Refill:  3  . mometasone (NASONEX) 50 MCG/ACT nasal spray    Sig: Place 2 sprays into the nose daily.    Dispense:  17 g    Refill:  [place], MD [facility] 07/24/2015, 4:19 PM

## 2015-07-24 NOTE — Addendum Note (Signed)
Addended by: Karle Plumber on: 07/24/2015 05:49 PM   Modules accepted: Orders

## 2015-07-24 NOTE — Patient Instructions (Signed)
Great to see you!  Call in 2-4 days if your sinuses aren't getting better, we should take at least a 4 month break from steroid shots now.   We will arrange an orthapedic referral.   We will call with x ray results

## 2015-08-01 ENCOUNTER — Telehealth: Payer: Self-pay | Admitting: Family Medicine

## 2015-08-01 MED ORDER — AMOXICILLIN-POT CLAVULANATE 875-125 MG PO TABS: 1.0000 | ORAL_TABLET | Freq: Two times a day (BID) | ORAL | Status: DC

## 2015-08-01 NOTE — Telephone Encounter (Signed)
Discussed with patient at visit.   Augmentin.   Laroy Apple, MD [facility] 08/01/2015, 5:08 PM

## 2015-08-02 NOTE — Telephone Encounter (Signed)
Detailed message left for patient.

## 2015-08-03 ENCOUNTER — Telehealth: Payer: Self-pay | Admitting: Family Medicine

## 2015-08-03 ENCOUNTER — Telehealth: Payer: Self-pay

## 2015-08-03 MED ORDER — CEFDINIR 300 MG PO CAPS: 300.0000 mg | ORAL_CAPSULE | Freq: Two times a day (BID) | ORAL | Status: DC

## 2015-08-03 MED ORDER — TADALAFIL 20 MG PO TABS: 20.0000 mg | ORAL_TABLET | Freq: Every day | ORAL | Status: DC | PRN

## 2015-08-03 NOTE — Telephone Encounter (Signed)
Left detailed message that antibiotic was changed and the other requested rx was sent to pharmacy and to [facility] with any further questions or concerns.

## 2015-08-03 NOTE — Telephone Encounter (Signed)
I have changed Augmentin to Omnicef.  I also sent the [place], MD [facility] 08/03/2015, 4:39 PM

## 2015-08-03 NOTE — Telephone Encounter (Signed)
cialis sent.

## 2015-08-03 NOTE — Telephone Encounter (Signed)
Insurance denied Sildenafil due to drugs used for treatment of erectile dysfunction are excluded from coverage under Medicare

## 2015-08-09 ENCOUNTER — Other Ambulatory Visit: Payer: Self-pay

## 2015-08-09 MED ORDER — LEVOCETIRIZINE DIHYDROCHLORIDE 5 MG PO TABS: 5.0000 mg | ORAL_TABLET | Freq: Every evening | ORAL | Status: DC

## 2015-08-10 ENCOUNTER — Ambulatory Visit (INDEPENDENT_AMBULATORY_CARE_PROVIDER_SITE_OTHER): Payer: Medicare Other | Admitting: Family Medicine

## 2015-08-10 ENCOUNTER — Encounter: Payer: Self-pay | Admitting: Family Medicine

## 2015-08-10 VITALS — BP 151/93 | HR 71 | Temp 97.0°F | Ht 70.0 in | Wt 209.5 lb

## 2015-08-10 DIAGNOSIS — I10 Essential (primary) hypertension: Secondary | ICD-10-CM | POA: Diagnosis not present

## 2015-08-10 DIAGNOSIS — E785 Hyperlipidemia, unspecified: Secondary | ICD-10-CM | POA: Diagnosis not present

## 2015-08-10 DIAGNOSIS — J381 Polyp of vocal cord and larynx: Secondary | ICD-10-CM

## 2015-08-10 DIAGNOSIS — R0789 Other chest pain: Secondary | ICD-10-CM

## 2015-08-10 DIAGNOSIS — Z0000 Encounter for general adult medical examination without abnormal findings: Secondary | ICD-10-CM

## 2015-08-10 DIAGNOSIS — J320 Chronic maxillary sinusitis: Secondary | ICD-10-CM

## 2015-08-10 MED ORDER — TRAZODONE HCL 100 MG PO TABS: 50.0000 mg | ORAL_TABLET | Freq: Every evening | ORAL | Status: DC | PRN

## 2015-08-10 NOTE — Patient Instructions (Addendum)
Great to see you!  We will call within a week with results  Continue crestor Try a baby aspirin  You will be called to arrange Cardiology and ENT appointments  Nonspecific Chest Pain  Chest pain can be caused by many different conditions. There is always a chance that your pain could be related to something serious, such as a heart attack or a blood clot in your lungs. Chest pain can also be caused by conditions that are not life-threatening. If you have chest pain, it is very important to follow up with your health care provider. CAUSES  Chest pain can be caused by:  Heartburn.  Pneumonia or bronchitis.  Anxiety or stress.  Inflammation around your heart (pericarditis) or lung (pleuritis or pleurisy).  A blood clot in your lung.  A collapsed lung (pneumothorax). It can develop suddenly on its own (spontaneous pneumothorax) or from trauma to the chest.  Shingles infection (varicella-zoster virus).  Heart attack.  Damage to the bones, muscles, and cartilage that make up your chest wall. This can include:  Bruised bones due to injury.  Strained muscles or cartilage due to frequent or repeated coughing or overwork.  Fracture to one or more ribs.  Sore cartilage due to inflammation (costochondritis). RISK FACTORS  Risk factors for chest pain may include:  Activities that increase your risk for trauma or injury to your chest.  Respiratory infections or conditions that cause frequent coughing.  Medical conditions or overeating that can cause heartburn.  Heart disease or family history of heart disease.  Conditions or health behaviors that increase your risk of developing a blood clot.  Having had chicken pox (varicella zoster). SIGNS AND SYMPTOMS Chest pain can feel like:  Burning or tingling on the surface of your chest or deep in your chest.  Crushing, pressure, aching, or squeezing pain.  Dull or sharp pain that is worse when you move, cough, or take a deep  breath.  Pain that is also felt in your back, neck, shoulder, or arm, or pain that spreads to any of these areas. Your chest pain may come and go, or it may stay constant. DIAGNOSIS Lab tests or other studies may be needed to find the cause of your pain. Your health care provider may have you take a test called an ambulatory ECG (electrocardiogram). An ECG records your heartbeat patterns at the time the test is performed. You may also have other tests, such as:  Transthoracic echocardiogram (TTE). During echocardiography, sound waves are used to create a picture of all of the heart structures and to look at how blood flows through your heart.  Transesophageal echocardiogram (TEE).This is a more advanced imaging test that obtains images from inside your body. It allows your health care provider to see your heart in finer detail.  Cardiac monitoring. This allows your health care provider to monitor your heart rate and rhythm in real time.  Holter monitor. This is a portable device that records your heartbeat and can help to diagnose abnormal heartbeats. It allows your health care provider to track your heart activity for several days, if needed.  Stress tests. These can be done through exercise or by taking medicine that makes your heart beat more quickly.  Blood tests.  Imaging tests. TREATMENT  Your treatment depends on what is causing your chest pain. Treatment may include:  Medicines. These may include:  Acid blockers for heartburn.  Anti-inflammatory medicine.  Pain medicine for inflammatory conditions.  Antibiotic medicine, if an infection is  present.  Medicines to dissolve blood clots.  Medicines to treat coronary artery disease.  Supportive care for conditions that do not require medicines. This may include:  Resting.  Applying heat or cold packs to injured areas.  Limiting activities until pain decreases. HOME CARE INSTRUCTIONS  If you were prescribed an  antibiotic medicine, finish it all even if you start to feel better.  Avoid any activities that bring on chest pain.  Do not use any tobacco products, including cigarettes, chewing tobacco, or electronic cigarettes. If you need help quitting, ask your health care provider.  Do not drink alcohol.  Take medicines only as directed by your health care provider.  Keep all follow-up visits as directed by your health care provider. This is important. This includes any further testing if your chest pain does not go away.  If heartburn is the cause for your chest pain, you may be told to keep your head raised (elevated) while sleeping. This reduces the chance that acid will go from your stomach into your esophagus.  Make lifestyle changes as directed by your health care provider. These may include:  Getting regular exercise. Ask your health care provider to suggest some activities that are safe for you.  Eating a heart-healthy diet. A registered dietitian can help you to learn healthy eating options.  Maintaining a healthy weight.  Managing diabetes, if necessary.  Reducing stress. SEEK MEDICAL CARE IF:  Your chest pain does not go away after treatment.  You have a rash with blisters on your chest.  You have a fever. SEEK IMMEDIATE MEDICAL CARE IF:   Your chest pain is worse.  You have an increasing cough, or you cough up blood.  You have severe abdominal pain.  You have severe weakness.  You faint.  You have chills.  You have sudden, unexplained chest discomfort.  You have sudden, unexplained discomfort in your arms, back, neck, or jaw.  You have shortness of breath at any time.  You suddenly start to sweat, or your skin gets clammy.  You feel nauseous or you vomit.  You suddenly feel light-headed or dizzy.  Your heart begins to beat quickly, or it feels like it is skipping beats. These symptoms may represent a serious problem that is an emergency. Do not wait to  see if the symptoms will go away. Get medical help right away. Call your local emergency services (911 in the [place] Do not drive yourself to the hospital.   This information is not intended to replace advice given to you by your health care provider. Make sure you discuss any questions you have with your health care provider.   Document Released: 03/27/2005 Document Revised: 07/08/2014 Document Reviewed: 01/21/2014 Elsevier Interactive Patient Education [employer].

## 2015-08-10 NOTE — Progress Notes (Signed)
   HPI  Patient presents today here to discuss chest pain chronic sinusitis, andhistory of laryngeal polyps.  Chronic sinusitis and history of laryngeal polyps. He states that previously he had to see an ENT surgeon every few years to get laryngeal polyps removed he is beginning to have similar symptoms again, he notes difficultyproducing sputum, throat fullness No problemstolerating food or fluid, no problems breathing.   Chest pain He describes a pressure type chest pain in the central chest that is nonradiating the last a few minutes at a time, it is never associated with exertion, it seems to occur only whenever he is lying down at very concerned about his heart help as his father had heart disease in his 63s. No dyspnea, sweating, or faintness associated with the chest pain.  He brings in a "lifescan" report which we discussed together   PMH: Smoking status noted ROS: Per HPI  Objective: BP 151/93 mmHg  Pulse 71  Temp(Src) 97 F (36.1 C) (Oral)  Ht _0  (1.778 m)  Wt 209 lb 8 oz (95.029 kg)  BMI 30.06 kg/m2 Gen: NAD, alert, cooperative with exam HEENT: NCAT CV: RRR, good S1/S2, no murmur, no carotid bruit Resp: CTABL, no wheezes, non-labored Ext: No edema, warm Neuro: Alert and oriented, No gross deficits   EKG WNL- NSR  Assessment and plan:  # Atypical chest pain Unlikely cardiac in nature, more likely anxiety Given Risk factors- Family Hx, HTN, HLD will refer to cards to discuss stress test  # Hx of laryngeal polyps, Chronic sinusitis Refer to ENT per his request  HTN Elevated, missed meds tday    Orders Placed This Encounter  Procedures  . CMP14+EGFR  . Lipid panel  . Ambulatory referral to Cardiology    Referral Priority:  Routine    Referral Type:  Consultation    Referral Reason:  Specialty Services Required    Requested Specialty:  Cardiology    Number of Visits Requested:  1  . Ambulatory referral to ENT    Referral Priority:  Routine   Referral Type:  Consultation    Referral Reason:  Specialty Services Required    Requested Specialty:  Otolaryngology    Number of Visits Requested:  1  . EKG 12-Lead    Meds ordered this encounter  Medications  . sildenafil (REVATIO) 20 MG tablet    Sig:     Laroy Apple, MD [facility] 08/10/2015, 2:34 PM

## 2015-08-11 LAB — CMP14+EGFR
ALBUMIN: 4.7 g/dL (ref 3.6–4.8)
ALK PHOS: 49 IU/L (ref 39–117)
ALT: 12 IU/L (ref 0–44)
AST: 25 IU/L (ref 0–40)
Albumin/Globulin Ratio: 2.1 (ref 1.1–2.5)
BILIRUBIN TOTAL: 0.4 mg/dL (ref 0.0–1.2)
BUN / CREAT RATIO: 23 — AB (ref 10–22)
BUN: 22 mg/dL (ref 8–27)
CO2: 22 mmol/L (ref 18–29)
CREATININE: 0.95 mg/dL (ref 0.76–1.27)
Calcium: 10 mg/dL (ref 8.6–10.2)
Chloride: 99 mmol/L (ref 96–106)
GFR calc non Af Amer: 87 mL/min/{1.73_m2} (ref >59)
GFR, EST AFRICAN AMERICAN: 100 mL/min/{1.73_m2} (ref >59)
GLOBULIN, TOTAL: 2.2 g/dL (ref 1.5–4.5)
Glucose: 90 mg/dL (ref 65–99)
Potassium: 4 mmol/L (ref 3.5–5.2)
SODIUM: 140 mmol/L (ref 134–144)
TOTAL PROTEIN: 6.9 g/dL (ref 6.0–8.5)

## 2015-08-11 LAB — LIPID PANEL
CHOL/HDL RATIO: 5.7 ratio — AB (ref 0.0–5.0)
Cholesterol, Total: 280 mg/dL — ABNORMAL HIGH (ref 100–199)
HDL: 49 mg/dL (ref >39)
LDL CALC: 181 mg/dL — AB (ref 0–99)
TRIGLYCERIDES: 251 mg/dL — AB (ref 0–149)
VLDL CHOLESTEROL CAL: 50 mg/dL — AB (ref 5–40)

## 2015-08-11 LAB — HEPATITIS C ANTIBODY

## 2015-08-14 ENCOUNTER — Telehealth: Payer: Self-pay | Admitting: Family Medicine

## 2015-08-14 MED ORDER — ROSUVASTATIN CALCIUM 10 MG PO TABS: 10.0000 mg | ORAL_TABLET | Freq: Every day | ORAL | Status: DC

## 2015-08-14 NOTE — Telephone Encounter (Signed)
Sent higher dose, will ask nursing to emphasize compliance.   Laroy Apple, MD [facility] 08/14/2015, 5:01 PM

## 2015-08-14 NOTE — Telephone Encounter (Signed)
-----   Message from David Simon, [place] sent at 08/11/2015  5:12 PM EST ----- Pt aware of labs and states that he has missed some dosage of his Crestor. He states that he thinks it was not a lot but did miss some days.

## 2015-08-16 ENCOUNTER — Encounter: Payer: Self-pay | Admitting: *Deleted

## 2015-08-24 ENCOUNTER — Telehealth: Payer: Self-pay | Admitting: Family Medicine

## 2015-08-24 NOTE — Telephone Encounter (Signed)
Pt advised he would ntbs to discuss pain meds, pt given appt with Dr.Bradshaw 2/28 at 10:55.

## 2015-08-25 ENCOUNTER — Ambulatory Visit: Payer: Medicare (Managed Care) | Admitting: Cardiology

## 2015-08-29 ENCOUNTER — Telehealth: Payer: Self-pay | Admitting: Family Medicine

## 2015-08-29 ENCOUNTER — Ambulatory Visit (INDEPENDENT_AMBULATORY_CARE_PROVIDER_SITE_OTHER): Payer: Medicare Other | Admitting: Family Medicine

## 2015-08-29 ENCOUNTER — Encounter: Payer: Self-pay | Admitting: Family Medicine

## 2015-08-29 VITALS — BP 149/88 | HR 62 | Temp 97.5°F | Ht 70.0 in | Wt 211.0 lb

## 2015-08-29 DIAGNOSIS — F1290 Cannabis use, unspecified, uncomplicated: Secondary | ICD-10-CM

## 2015-08-29 DIAGNOSIS — F1210 Cannabis abuse, uncomplicated: Secondary | ICD-10-CM | POA: Diagnosis not present

## 2015-08-29 DIAGNOSIS — M4806 Spinal stenosis, lumbar region: Secondary | ICD-10-CM

## 2015-08-29 DIAGNOSIS — E785 Hyperlipidemia, unspecified: Secondary | ICD-10-CM

## 2015-08-29 DIAGNOSIS — G4700 Insomnia, unspecified: Secondary | ICD-10-CM

## 2015-08-29 DIAGNOSIS — M48061 Spinal stenosis, lumbar region without neurogenic claudication: Secondary | ICD-10-CM | POA: Insufficient documentation

## 2015-08-29 MED ORDER — MELOXICAM 15 MG PO TABS: 15.0000 mg | ORAL_TABLET | Freq: Every day | ORAL | Status: DC

## 2015-08-29 MED ORDER — TRAZODONE HCL 300 MG PO TABS: 300.0000 mg | ORAL_TABLET | Freq: Every evening | ORAL | Status: DC | PRN

## 2015-08-29 MED ORDER — CYCLOBENZAPRINE HCL 10 MG PO TABS: 10.0000 mg | ORAL_TABLET | Freq: Three times a day (TID) | ORAL | Status: DC | PRN

## 2015-08-29 NOTE — Telephone Encounter (Signed)
Pharmacy called and they can not get Trazadone 300mg  and pharmacy wanted to know if they can do 150mg  2 qhs. Pharmacy advised to give 150mg  2 qhs.

## 2015-08-29 NOTE — Telephone Encounter (Signed)
Agree with that change.   Laroy Apple, MD [facility] 08/29/2015, 5:17 PM

## 2015-08-29 NOTE — Patient Instructions (Signed)
Great to see you!  We will work on orthopedics for you  Take mobic (meloxicam) once daily, do not take aleve or ibuprofen with it  300 mg trazodone is the maximum dose.   Come back in 4 months for cholesterol check

## 2015-08-29 NOTE — Progress Notes (Signed)
   HPI  Patient presents today here for back pain, hyperlipidemia, insomnia.  Back pain Chronic back pain with history of degenerative disc disease, spinal stenosis. He's previously treated with epidural injections successfully, he like to be referred to orthopedic practice for this. He describes right-sided low back pain with right-sided sciatica, the feeling of right-sided leg weakness, no falling or difficulty walking. No bowel or bladder dysfunction, saddle anesthesia Previously treated with Percocet and requests this today. Also previously treated with Celebrex.  nsomnia Some help with sleep from 200 mg of trazodone. Would like to go up on the dose.  Hyperlipidemia Good Crestor compliance now, previously missing doses his diet is improving a lot, he states that through the wintertime he had a difficult time controlling his diet   He admits to smoking marijuana rare alcohol use  PMH: Smoking status noted ROS: Per HPI  Objective: BP 149/88 mmHg  Pulse 62  Temp(Src) 97.5 F (36.4 C) (Oral)  Ht 5\' 10"  (1.778 m)  Wt 211 lb (95.709 kg)  BMI 30.28 kg/m2 Gen: NAD, alert, cooperative with exam HEENT: NCAT CV: RRR, good S1/S2, no murmur Resp: CTABL, no wheezes, non-labored Ext: No edema, warm Neuro: Alert and oriented, 1+ symmetric patellar tendon reflexes, strength 5/5 and sensation intact in bilateral lower extremities MSK: No lumbar or paraspinal muscle tenderness to palpation, negative straight leg raise  Assessment and plan:  # chronic back pain,spinal stenosis, degenerative disc disease Start meloxicam for no more than 3 months Flexeril Offered PT Declined opiates with marijuana use referral orthopedics for likely epidural injections  # HLD Discussed compliance and diet Recheck Lipid in 3-4 months  # insomnia Increase trazodone, 300 mg per night  #  Marijuana  explained that this is a contraindication for maybe an opiate medications    Orders Placed  This Encounter  Procedures  . Ambulatory referral to Orthopedic Surgery    Referral Priority:  Routine    Referral Type:  Surgical    Referral Reason:  Specialty Services Required    Requested Specialty:  Orthopedic Surgery    Number of Visits Requested:  1    Meds ordered this encounter  Medications  . meloxicam (MOBIC) 15 MG tablet    Sig: Take 1 tablet (15 mg total) by mouth daily.    Dispense:  30 tablet    Refill:  2  . cyclobenzaprine (FLEXERIL) 10 MG tablet    Sig: Take 1 tablet (10 mg total) by mouth 3 (three) times daily as needed for muscle spasms.    Dispense:  60 tablet    Refill:  2  . traZODone (DESYREL) 300 MG tablet    Sig: Take 1 tablet (300 mg total) by mouth at bedtime as needed for sleep.    Dispense:  30 tablet    Refill:  [place], MD [facility] 08/29/2015, 11:35 AM

## 2015-09-05 ENCOUNTER — Ambulatory Visit: Payer: Medicare Other | Admitting: Family Medicine

## 2015-09-06 ENCOUNTER — Encounter: Payer: Self-pay | Admitting: Family Medicine

## 2015-09-11 ENCOUNTER — Telehealth: Payer: Self-pay | Admitting: Family Medicine

## 2015-09-11 MED ORDER — TADALAFIL 5 MG PO TABS: 5.0000 mg | ORAL_TABLET | Freq: Every day | ORAL | Status: DC | PRN

## 2015-09-11 NOTE — Telephone Encounter (Signed)
Rx sent by request  Laroy Apple, MD [facility] 09/11/2015, 5:04 PM

## 2015-09-12 NOTE — Telephone Encounter (Signed)
Detailed message left for patient.

## 2015-09-12 NOTE — Telephone Encounter (Signed)
Lm for pt to rtn my call

## 2015-09-14 ENCOUNTER — Ambulatory Visit (INDEPENDENT_AMBULATORY_CARE_PROVIDER_SITE_OTHER): Payer: Medicare Other | Admitting: Otolaryngology

## 2015-09-14 ENCOUNTER — Encounter: Payer: Self-pay | Admitting: Cardiology

## 2015-09-14 ENCOUNTER — Other Ambulatory Visit (INDEPENDENT_AMBULATORY_CARE_PROVIDER_SITE_OTHER): Payer: Self-pay | Admitting: Otolaryngology

## 2015-09-14 ENCOUNTER — Encounter (INDEPENDENT_AMBULATORY_CARE_PROVIDER_SITE_OTHER): Payer: Self-pay

## 2015-09-14 ENCOUNTER — Ambulatory Visit (INDEPENDENT_AMBULATORY_CARE_PROVIDER_SITE_OTHER): Payer: Medicare Other | Admitting: Cardiology

## 2015-09-14 VITALS — BP 150/90 | HR 99 | Ht 70.0 in | Wt 212.6 lb

## 2015-09-14 DIAGNOSIS — J343 Hypertrophy of nasal turbinates: Secondary | ICD-10-CM | POA: Diagnosis not present

## 2015-09-14 DIAGNOSIS — E785 Hyperlipidemia, unspecified: Secondary | ICD-10-CM

## 2015-09-14 DIAGNOSIS — Z8639 Personal history of other endocrine, nutritional and metabolic disease: Secondary | ICD-10-CM

## 2015-09-14 DIAGNOSIS — Z8249 Family history of ischemic heart disease and other diseases of the circulatory system: Secondary | ICD-10-CM

## 2015-09-14 DIAGNOSIS — I10 Essential (primary) hypertension: Secondary | ICD-10-CM | POA: Diagnosis not present

## 2015-09-14 DIAGNOSIS — J330 Polyp of nasal cavity: Secondary | ICD-10-CM | POA: Diagnosis not present

## 2015-09-14 DIAGNOSIS — R072 Precordial pain: Secondary | ICD-10-CM

## 2015-09-14 DIAGNOSIS — J320 Chronic maxillary sinusitis: Secondary | ICD-10-CM

## 2015-09-14 NOTE — Progress Notes (Signed)
Cardiology Office Note  Date: 09/14/2015   ID: David Simon, DOB August 15, 1955, MRN [id number]  PCP: Kenn File, MD  Consulting Cardiologist: Rozann Lesches, MD   Chief Complaint  Patient presents with  . Chest Pain    History of Present Illness: David Simon is a 60 y.o. male referred for cardiology consultation by Dr. Wendi Snipes. He presents for evaluation of chest discomfort, describes a "tightness" in his chest that was moderate, occurring intermittently with activity approximately 4-6 weeks ago in onset. He states that he had the flu at that time, slow to improve, did not have resolution of chest discomfort until about a week ago. He has also been suffering with allergies and sinus infections. At this time he reports NYHA class II dyspnea.  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 States that he tries to stay active although does have orthopedic limitations with chronic pain. He reports compliance with his medicines, was not taking Crestor regularly however when his most recent lipids in February were obtained. He has a family history of premature CAD in his father. LDL was 181 recently.  Current medical regimen includes Lopressor, Crestor, and Prinzide. He also has erectile dysfunction and is using Cialis.  He states that he underwent stress testing when he lived in [place] approximately 5 or 6 years ago, no obvious abnormalities that he can recall. He has not had any interval ischemic testing.  Past Medical History  Diagnosis Date  . Essential hypertension   . Hypercholesterolemia   . Fracture of neck ([place])     Traumatic event 2008  . Seasonal allergies   . Anxiety   . Arthritis   . Asthma   . Depression   . History of diabetes mellitus     Past Surgical History  Procedure Laterality Date  . Carpal tunnel release    . Nasal sinus surgery    . Hip surgery      Current Outpatient Prescriptions  Medication Sig Dispense Refill  . cyclobenzaprine (FLEXERIL) 10 MG tablet  Take 1 tablet (10 mg total) by mouth 3 (three) times daily as needed for muscle spasms. 60 tablet 2  . DULoxetine (CYMBALTA) 60 MG capsule Take 1 capsule (60 mg total) by mouth daily. 90 capsule 3  . fluticasone (FLONASE) 50 MCG/ACT nasal spray Place into both nostrils daily.    Marland Kitchen levocetirizine (XYZAL) 5 MG tablet Take 1 tablet (5 mg total) by mouth every evening. 30 tablet 4  . lisinopril-hydrochlorothiazide (PRINZIDE,ZESTORETIC) 20-25 MG tablet Take 1 tablet by mouth daily. 90 tablet 3  . meloxicam (MOBIC) 15 MG tablet Take 1 tablet (15 mg total) by mouth daily. 30 tablet 2  . metoprolol (LOPRESSOR) 100 MG tablet Take 1 tablet (100 mg total) by mouth 2 (two) times daily. 60 tablet 2  . rosuvastatin (CRESTOR) 10 MG tablet Take 1 tablet (10 mg total) by mouth daily. 30 tablet 3  . sildenafil (REVATIO) 20 MG tablet     . tadalafil (CIALIS) 5 MG tablet Take 1 tablet (5 mg total) by mouth daily as needed for erectile dysfunction. 10 tablet 5  . traZODone (DESYREL) 300 MG tablet Take 1 tablet (300 mg total) by mouth at bedtime as needed for sleep. 30 tablet 3   No current facility-administered medications for this visit.   Allergies:  Atorvastatin and Other   Social History: The patient  reports that he has been smoking Cigars.  He does not have any smokeless tobacco history on file. He reports that he  drinks alcohol. He reports that he does not use illicit drugs.   Family History: The patient's family history includes Heart disease in his father; High Cholesterol in his mother; Hypertension in his mother.   ROS:  Please see the history of present illness. Otherwise, complete review of systems is positive for chronic arthritic pains, seasonal allergies, sinus infections.  All other systems are reviewed and negative.   Physical Exam: VS:  BP 150/90 mmHg  Pulse 99  Ht 5\' 10"  (1.778 m)  Wt 212 lb 9.6 oz (96.435 kg)  BMI 30.51 kg/m2  SpO2 93%, BMI Body mass index is 30.51 kg/(m^2).  Wt  Readings from Last 3 Encounters:  09/14/15 212 lb 9.6 oz (96.435 kg)  08/29/15 211 lb (95.709 kg)  08/10/15 209 lb 8 oz (95.029 kg)    General: Overweight male, appears comfortable at rest. HEENT: Conjunctiva and lids normal, oropharynx clear. Neck: Supple, no elevated JVP or carotid bruits, no thyromegaly. Lungs: Clear to auscultation, nonlabored breathing at rest. Cardiac: Regular rate and rhythm, no S3 or significant systolic murmur, no pericardial rub. Abdomen: Soft, nontender, no hepatomegaly, bowel sounds present, no guarding or rebound. Extremities: No pitting edema, distal pulses 2+. Skin: Warm and dry. Musculoskeletal: No kyphosis. Neuropsychiatric: Alert and oriented x3, affect grossly appropriate.  ECG: I personally reviewed the prior tracing from 08/10/2015 which showed normal sinus rhythm.  Recent Labwork: 06/07/2015: Platelets 282 08/10/2015: ALT 12; AST 25; BUN 22; Creatinine, Ser 0.95; Potassium 4.0; Sodium 140     Component Value Date/Time   CHOL 280* 08/10/2015 1447   TRIG 251* 08/10/2015 1447   HDL 49 08/10/2015 1447   CHOLHDL 5.7* 08/10/2015 1447   LDLCALC 181* 08/10/2015 1447    Other Studies Reviewed Today:  Cervical spine films 07/24/2015: IMPRESSION: Rather marked osteoarthritic change focally at C4-5. Mild spondylolisthesis at C4-5 is felt to be due to the underlying spondylosis at this level. Lesser osteoarthritic changes noted at C5-6 and C7-T1. No fracture or spondylolisthesis. There is calcification in the left carotid artery.  Assessment and Plan:  1. Intermittent exertional chest tightness over the last 4-6 weeks, improved at this time. Patient reports having the flu around initiation of symptoms and was slow to recover. He has active cardiac risk factors including age and gender, family history of premature CAD in his father, personal history of hypertension, hyperlipidemia, and prior diabetes mellitus. Recent ECG showed normal sinus rhythm. He is  on high-dose Lopressor and also has orthopedic limitations. We will proceed with further objective ischemic evaluation in the form of a Lexiscan Cardiolite.  2. Essential hypertension, blood pressure elevated today. He is on reasonable regimen including Prinzide and Lopressor. Keep follow-up with Dr. Wendi Snipes.  3. Hyperlipidemia, recent LDL 181. He admits that he was not being as compliant with Crestor. States that he is now taking the medication daily.  4. Reported prior history of diabetes mellitus at younger age, details not clear. Recent random glucose 90.  Current medicines were reviewed with the patient today.   Orders Placed This Encounter  Procedures  . NM Myocar Multi W/Spect W/Wall Motion / EF    Disposition: Call with results.   Signed, Satira Sark, MD, [facility] 09/14/2015 9:32 AM    [facility] [place]. [address] [place], [place] [address] Phone: [phone]; Fax: [phone]

## 2015-09-14 NOTE — Patient Instructions (Signed)
Medication Instructions:  Your physician recommends that you continue on your current medications as directed. Please refer to the Current Medication list given to you today.   Labwork: NONE  Testing/Procedures: Your physician has requested that you have a lexiscan myoview. For further information please visit [email or web address]. Please follow instruction sheet, as given.    Follow-Up: Your physician recommends that you schedule a follow-up appointment in: WE WILL CALL WITH TEST RESULTS    Any Other Special Instructions Will Be Listed Below (If Applicable).  Thanks for choosing [facility]!!!     If you need a refill on your cardiac medications before your next appointment, please call your pharmacy.

## 2015-09-19 ENCOUNTER — Ambulatory Visit (HOSPITAL_COMMUNITY)
Admission: RE | Admit: 2015-09-19 | Discharge: 2015-09-19 | Disposition: A | Discharge status: Home / Self Care | Payer: Medicare Other | Source: Ambulatory Visit | Attending: Otolaryngology | Admitting: Otolaryngology

## 2015-09-19 DIAGNOSIS — J320 Chronic maxillary sinusitis: Secondary | ICD-10-CM | POA: Diagnosis not present

## 2015-09-19 DIAGNOSIS — J0100 Acute maxillary sinusitis, unspecified: Secondary | ICD-10-CM | POA: Insufficient documentation

## 2015-09-19 DIAGNOSIS — J019 Acute sinusitis, unspecified: Secondary | ICD-10-CM | POA: Insufficient documentation

## 2015-09-19 DIAGNOSIS — J329 Chronic sinusitis, unspecified: Secondary | ICD-10-CM | POA: Diagnosis not present

## 2015-09-19 DIAGNOSIS — J328 Other chronic sinusitis: Secondary | ICD-10-CM | POA: Insufficient documentation

## 2015-09-26 ENCOUNTER — Encounter (HOSPITAL_COMMUNITY)
Admission: RE | Admit: 2015-09-26 | Discharge: 2015-09-26 | Disposition: A | Discharge status: Home / Self Care | Payer: Medicare Other | Source: Ambulatory Visit | Attending: Cardiology | Admitting: Cardiology

## 2015-09-26 ENCOUNTER — Inpatient Hospital Stay (HOSPITAL_COMMUNITY): Admission: RE | Admit: 2015-09-26 | Payer: Medicare (Managed Care) | Source: Ambulatory Visit

## 2015-09-26 ENCOUNTER — Encounter (HOSPITAL_COMMUNITY): Payer: Self-pay

## 2015-09-26 DIAGNOSIS — Z8249 Family history of ischemic heart disease and other diseases of the circulatory system: Secondary | ICD-10-CM | POA: Diagnosis not present

## 2015-09-26 DIAGNOSIS — R072 Precordial pain: Secondary | ICD-10-CM | POA: Insufficient documentation

## 2015-09-26 LAB — NM MYOCAR MULTI W/SPECT W/WALL MOTION / EF
CHL CUP RESTING HR STRESS: 60 {beats}/min
Peak HR: 93 {beats}/min

## 2015-09-26 MED ORDER — SODIUM CHLORIDE 0.9% FLUSH
INTRAVENOUS | Status: AC
  Administered 2015-09-26: 10 mL via INTRAVENOUS
  Filled 2015-09-26: qty 10

## 2015-09-26 MED ORDER — REGADENOSON 0.4 MG/5ML IV SOLN
INTRAVENOUS | Status: AC
  Administered 2015-09-26: 0.4 mg via INTRAVENOUS
  Filled 2015-09-26: qty 5

## 2015-09-26 MED ORDER — TECHNETIUM TC 99M SESTAMIBI - CARDIOLITE
30.0000 | Freq: Once | INTRAVENOUS | Status: AC | PRN
  Administered 2015-09-26: 11:00:00 30 via INTRAVENOUS

## 2015-09-26 MED ORDER — TECHNETIUM TC 99M SESTAMIBI GENERIC - CARDIOLITE
10.0000 | Freq: Once | INTRAVENOUS | Status: AC | PRN
  Administered 2015-09-26: 10 via INTRAVENOUS

## 2015-10-05 ENCOUNTER — Ambulatory Visit (INDEPENDENT_AMBULATORY_CARE_PROVIDER_SITE_OTHER): Payer: Medicare Other | Admitting: Family Medicine

## 2015-10-05 ENCOUNTER — Ambulatory Visit (INDEPENDENT_AMBULATORY_CARE_PROVIDER_SITE_OTHER): Payer: Medicare Other | Admitting: Otolaryngology

## 2015-10-05 ENCOUNTER — Encounter: Payer: Self-pay | Admitting: Family Medicine

## 2015-10-05 ENCOUNTER — Telehealth: Payer: Self-pay | Admitting: Family Medicine

## 2015-10-05 ENCOUNTER — Encounter (INDEPENDENT_AMBULATORY_CARE_PROVIDER_SITE_OTHER): Payer: Self-pay

## 2015-10-05 VITALS — BP 133/83 | HR 70 | Temp 97.1°F | Ht 70.0 in | Wt 212.6 lb

## 2015-10-05 DIAGNOSIS — F331 Major depressive disorder, recurrent, moderate: Secondary | ICD-10-CM

## 2015-10-05 DIAGNOSIS — F329 Major depressive disorder, single episode, unspecified: Secondary | ICD-10-CM | POA: Insufficient documentation

## 2015-10-05 DIAGNOSIS — J320 Chronic maxillary sinusitis: Secondary | ICD-10-CM

## 2015-10-05 MED ORDER — RISPERIDONE 0.5 MG PO TABS: 0.5000 mg | ORAL_TABLET | Freq: Every day | ORAL | Status: DC

## 2015-10-05 NOTE — Patient Instructions (Signed)
Great to see you!  Try risperdal 1 pill each night Come back in 4 weeks  If you have any increased suicidal thoughts stop the medicine and reach out for help.

## 2015-10-05 NOTE — Progress Notes (Signed)
   HPI  Patient presents today to discuss depression.  Patient explains that he is struggling through his divorce and feels like he needs more depression medication. He is also having difficulty with chronic pain, however this was helped by recent addition of meloxicam that he has quit.  He's had a recent stress test which was normal. He's scheduled for ENT surgery.  He has decreased sleep, fatigue, change in appetite, trouble concentrating. Trazodone has helped minimally Cymbalta helped with his pain is well at first, however now he feels like it's plateaued.  He does not want anything that will effect erections   PMH: Smoking status noted ROS: Per HPI  Objective: BP 133/83 mmHg  Pulse 70  Temp(Src) 97.1 F (36.2 C) (Oral)  Ht 5\' 10"  (1.778 m)  Wt 212 lb 9.6 oz (96.435 kg)  BMI 30.51 kg/m2 Gen: NAD, alert, cooperative with exam HEENT: NCAT CV: RRR, good S1/S2, no murmur Resp: CTABL, no wheezes, non-labored Ext: No edema, warm Neuro: Alert and oriented, No gross deficits   Depression screen [facility] 2/9 10/05/2015  Decreased Interest 0  Down, Depressed, Hopeless 1  PHQ - 2 Score 1  Altered sleeping 3  Tired, decreased energy 3  Change in appetite 3  Feeling bad or failure about yourself  0  Trouble concentrating 2  Moving slowly or fidgety/restless 0  Suicidal thoughts 0  PHQ-9 Score 12     Assessment and plan:  # Major depressive disorder Improved with Cymbalta, however now seems to be worsening, his situation with going through his divorce is a source of serious stress. Has not done well with Wellbutrin in the past Addition of atypical antipsychotic, initially trying Abilify or Seroquel, however these are not covered well by insurance so I added Risperdal Discussed possibility of increased suicidal ideation, he denies today, he will reach out for help immediately if this happens Return to clinic in 3-4 weeks to assess tolerability and affect, likely increase to  1.2-1.6 mg Also discussed adverse effects this may have metabolically, he would like to try regardless     Meds ordered this encounter  Medications  . risperiDONE (RISPERDAL) 0.5 MG tablet    Sig: Take 1 tablet (0.5 mg total) by mouth at bedtime.    Dispense:  30 tablet    Refill:  [place], MD [facility] 10/05/2015, 11:57 AM

## 2015-10-11 ENCOUNTER — Other Ambulatory Visit: Payer: Self-pay | Admitting: Family Medicine

## 2015-10-16 ENCOUNTER — Other Ambulatory Visit: Payer: Self-pay | Admitting: Family Medicine

## 2015-10-16 NOTE — Telephone Encounter (Signed)
Last seen 10/05/15  Dr Wendi Snipes  If approved route to nurse to call into [phone]

## 2015-10-18 ENCOUNTER — Telehealth: Payer: Self-pay | Admitting: Family Medicine

## 2015-10-18 ENCOUNTER — Other Ambulatory Visit: Payer: Self-pay | Admitting: Family Medicine

## 2015-10-18 NOTE — Telephone Encounter (Signed)
Refilled, will ask nursing to call in as it is not allowing me to send to [facility] in [place] as requested.   Laroy Apple, MD [facility] 10/18/2015, 4:58 PM

## 2015-10-18 NOTE — Telephone Encounter (Signed)
lmtcb

## 2015-10-18 NOTE — Telephone Encounter (Signed)
The sidenafil was not called in. Re called in rx

## 2015-10-18 NOTE — Telephone Encounter (Signed)
Last seen 10/05/15  Dr Wendi Snipes  If approved route to nurse to call into [place]  [phone]

## 2015-10-19 MED ORDER — SILDENAFIL CITRATE 20 MG PO TABS: ORAL_TABLET | ORAL | Status: DC

## 2015-10-19 NOTE — Addendum Note (Signed)
Addended by: Thana Ates on: 10/19/2015 11:00 AM   Modules accepted: Orders

## 2015-10-19 NOTE — Telephone Encounter (Signed)
rx called into pharmacy

## 2015-11-16 ENCOUNTER — Other Ambulatory Visit: Payer: Self-pay | Admitting: *Deleted

## 2015-11-16 DIAGNOSIS — I10 Essential (primary) hypertension: Secondary | ICD-10-CM

## 2015-11-16 MED ORDER — METOPROLOL TARTRATE 100 MG PO TABS: 100.0000 mg | ORAL_TABLET | Freq: Two times a day (BID) | ORAL | Status: DC

## 2015-11-21 ENCOUNTER — Other Ambulatory Visit: Payer: Self-pay | Admitting: Family Medicine

## 2015-11-21 ENCOUNTER — Telehealth: Payer: Self-pay | Admitting: Family Medicine

## 2015-11-21 MED ORDER — SILDENAFIL CITRATE 20 MG PO TABS: ORAL_TABLET | ORAL | Status: DC

## 2015-11-21 NOTE — Telephone Encounter (Signed)
Rx sent, I have suggested calling around for a good price on revatio/sildenafil.   Laroy Apple, MD [facility] 11/21/2015, 12:44 PM

## 2015-12-02 ENCOUNTER — Telehealth: Payer: Self-pay | Admitting: Family Medicine

## 2015-12-02 NOTE — Telephone Encounter (Signed)
Left detailed message on pts VM per ROI that per 10/05/15's OV notes that Risperdal was added for him to continue both medications, if he had further questions to call the office back on Monday

## 2015-12-06 DIAGNOSIS — R0683 Snoring: Secondary | ICD-10-CM | POA: Diagnosis not present

## 2015-12-06 DIAGNOSIS — R0981 Nasal congestion: Secondary | ICD-10-CM | POA: Diagnosis not present

## 2015-12-06 DIAGNOSIS — M5441 Lumbago with sciatica, right side: Secondary | ICD-10-CM | POA: Diagnosis not present

## 2015-12-06 DIAGNOSIS — G8929 Other chronic pain: Secondary | ICD-10-CM | POA: Diagnosis not present

## 2015-12-06 DIAGNOSIS — M4807 Spinal stenosis, lumbosacral region: Secondary | ICD-10-CM | POA: Diagnosis not present

## 2015-12-06 DIAGNOSIS — G4733 Obstructive sleep apnea (adult) (pediatric): Secondary | ICD-10-CM | POA: Diagnosis not present

## 2015-12-06 DIAGNOSIS — J343 Hypertrophy of nasal turbinates: Secondary | ICD-10-CM | POA: Diagnosis not present

## 2015-12-06 DIAGNOSIS — M542 Cervicalgia: Secondary | ICD-10-CM | POA: Diagnosis not present

## 2015-12-06 DIAGNOSIS — J302 Other seasonal allergic rhinitis: Secondary | ICD-10-CM | POA: Diagnosis not present

## 2015-12-08 ENCOUNTER — Ambulatory Visit (INDEPENDENT_AMBULATORY_CARE_PROVIDER_SITE_OTHER): Payer: Medicare Other | Admitting: Family Medicine

## 2015-12-08 ENCOUNTER — Encounter: Payer: Self-pay | Admitting: Family Medicine

## 2015-12-08 VITALS — BP 142/92 | HR 66 | Temp 97.8°F | Ht 70.0 in | Wt 216.6 lb

## 2015-12-08 DIAGNOSIS — I10 Essential (primary) hypertension: Secondary | ICD-10-CM | POA: Diagnosis not present

## 2015-12-08 DIAGNOSIS — N529 Male erectile dysfunction, unspecified: Secondary | ICD-10-CM

## 2015-12-08 DIAGNOSIS — R0683 Snoring: Secondary | ICD-10-CM | POA: Diagnosis not present

## 2015-12-08 DIAGNOSIS — W57XXXA Bitten or stung by nonvenomous insect and other nonvenomous arthropods, initial encounter: Secondary | ICD-10-CM | POA: Diagnosis not present

## 2015-12-08 DIAGNOSIS — S1080XA Unspecified superficial injury of other specified part of neck, initial encounter: Secondary | ICD-10-CM

## 2015-12-08 MED ORDER — DOXYCYCLINE HYCLATE 100 MG PO TABS: 100.0000 mg | ORAL_TABLET | Freq: Two times a day (BID) | ORAL | Status: DC

## 2015-12-08 MED ORDER — SILDENAFIL CITRATE 20 MG PO TABS
ORAL_TABLET | ORAL | Status: DC
Start: 2015-12-08 — End: 2016-01-02

## 2015-12-08 MED ORDER — AMLODIPINE BESYLATE 10 MG PO TABS: 10.0000 mg | ORAL_TABLET | Freq: Every day | ORAL | Status: DC

## 2015-12-08 NOTE — Progress Notes (Signed)
   HPI  Patient presents today here after a tick bite.  Tick bite Patient explains he pulled off about one week ago, the tic was present for about 2-3 days, he describes it as a very small tic. He denies any fever, chills, sweats, or new arthralgias. He has severe chronic back pain. Reading food and fluids normally.  Hypertension States over the last week or 2 his blood pressures been much more elevated than usual. As high as 180s over 1 teens. No chest pain, dyspnea, palpitations, leg edema. Has been taking one extra metoprolol daily which has helped.  Erectile dysfunction Not really helped by Revatio, however his BPH is helped by one pill a night, he does use 5 pills at a time for erectile dysfunction with some improvement. Requests refills today  Snoring Explains he has severe snoring and periods of apnea at night he is waking up sleepy everyday He explains that he previously had obstructive sleep apnea with a sleep apnea with a CPAP machine, he would like a sleep study, his last study was 2002 in [place]  PMH: Smoking status noted ROS: Per HPI  Objective: BP 142/92 mmHg  Pulse 66  Temp(Src) 97.8 F (36.6 C) (Oral)  Ht 5\' 10"  (1.778 m)  Wt 216 lb 9.6 oz (98.249 kg)  BMI 31.08 kg/m2 Gen: NAD, alert, cooperative with exam HEENT: NCAT Neck Small papule on the left posterior proximal neck, also tender lymph node at the base of the head The papule is erythematous, no target lesion CV: RRR, good S1/S2, no murmur Resp: CTABL, no wheezes, non-labored Ext: No edema, warm Neuro: Alert and oriented, No gross deficits  Assessment and plan:  # tick bite Patient here with tick bite, described as tick similar severe take, present for 2-3 days. There have been several cases of Lyme positive patients recently that he's been in contact with, is very anxious about it. I will go ahead and cover with doxycycline  # Hypertension Borderline today Frequently if borderline, he reports  very high blood pressures recently home No other complaints except what is described here. Continue Prinzide, metoprolol, discussed using metoprolol only twice daily Add amlodipine  # Erectile dysfunction Refilled sildenafil per his request Also given samples of Viagra  # Snoring, history of OSA Pulmonology referral for consideration of sleep study Patient has loud snoring, daytime sleepiness, and periods of apnea reported     Orders Placed This Encounter  Procedures  . Ambulatory referral to Pulmonology    Referral Priority:  Routine    Referral Type:  Consultation    Referral Reason:  Specialty Services Required    Requested Specialty:  Pulmonary Disease    Number of Visits Requested:  1    Meds ordered this encounter  Medications  . doxycycline (VIBRA-TABS) 100 MG tablet    Sig: Take 1 tablet (100 mg total) by mouth 2 (two) times daily. 1 po bid    Dispense:  28 tablet    Refill:  0  . amLODipine (NORVASC) 10 MG tablet    Sig: Take 1 tablet (10 mg total) by mouth daily.    Dispense:  90 tablet    Refill:  3  . sildenafil (REVATIO) 20 MG tablet    Sig: Take 2 to 5 pills once daily as needed for ED    Dispense:  50 tablet    Refill:  [place], MD [facility] 12/08/2015, 11:00 AM

## 2015-12-08 NOTE — Patient Instructions (Signed)
Great to see you!  I am treating your tick bite and possible tickborne illness with doxycycline  Amlodipine is for your blood pressure, 1 pill once a day  I have sent refills of sildinafil

## 2015-12-11 ENCOUNTER — Other Ambulatory Visit: Payer: Self-pay | Admitting: Family Medicine

## 2015-12-13 DIAGNOSIS — M25511 Pain in right shoulder: Secondary | ICD-10-CM | POA: Diagnosis not present

## 2015-12-14 ENCOUNTER — Telehealth: Payer: Self-pay | Admitting: Family Medicine

## 2015-12-15 ENCOUNTER — Other Ambulatory Visit: Payer: Self-pay

## 2015-12-15 MED ORDER — TRAZODONE HCL 300 MG PO TABS: 300.0000 mg | ORAL_TABLET | Freq: Every evening | ORAL | Status: DC | PRN

## 2015-12-15 MED ORDER — NORTRIPTYLINE HCL 25 MG PO CAPS: 25.0000 mg | ORAL_CAPSULE | Freq: Three times a day (TID) | ORAL | Status: DC

## 2015-12-15 NOTE — Telephone Encounter (Signed)
Patient informed via detailed message 

## 2015-12-15 NOTE — Telephone Encounter (Signed)
I have changed him from cymbalta to nortriptyline in, 25 mg 3 times daily.  It is okay to stop Cymbalta and start nortriptyline the following day at regular dose.   Laroy Apple, MD [facility] 12/15/2015, 8:01 AM

## 2015-12-19 ENCOUNTER — Telehealth: Payer: Self-pay | Admitting: Family Medicine

## 2015-12-19 MED ORDER — TADALAFIL 5 MG PO TABS: 5.0000 mg | ORAL_TABLET | Freq: Every day | ORAL | Status: DC | PRN

## 2015-12-19 NOTE — Telephone Encounter (Signed)
Rx written  Laroy Apple, MD [facility] 12/19/2015, 5:05 PM

## 2015-12-29 ENCOUNTER — Other Ambulatory Visit: Payer: Self-pay | Admitting: Orthopedic Surgery

## 2015-12-29 DIAGNOSIS — M25511 Pain in right shoulder: Secondary | ICD-10-CM

## 2015-12-30 ENCOUNTER — Ambulatory Visit
Admission: RE | Admit: 2015-12-30 | Discharge: 2015-12-30 | Disposition: A | Discharge status: Home / Self Care | Payer: Medicare Other | Source: Ambulatory Visit | Attending: Orthopedic Surgery | Admitting: Orthopedic Surgery

## 2015-12-30 DIAGNOSIS — M25511 Pain in right shoulder: Secondary | ICD-10-CM

## 2015-12-30 DIAGNOSIS — M75111 Incomplete rotator cuff tear or rupture of right shoulder, not specified as traumatic: Secondary | ICD-10-CM | POA: Diagnosis not present

## 2016-01-02 ENCOUNTER — Other Ambulatory Visit: Payer: Self-pay | Admitting: Family Medicine

## 2016-01-03 DIAGNOSIS — M5136 Other intervertebral disc degeneration, lumbar region: Secondary | ICD-10-CM | POA: Diagnosis not present

## 2016-01-03 NOTE — Telephone Encounter (Signed)
Last seen 12/08/15  Dr Wendi Snipes

## 2016-01-05 ENCOUNTER — Telehealth: Payer: Self-pay | Admitting: Family Medicine

## 2016-01-05 NOTE — Telephone Encounter (Signed)
Needs to be seen  Laroy Apple, MD [facility] 01/05/2016, 2:55 PM

## 2016-01-11 ENCOUNTER — Other Ambulatory Visit: Payer: Self-pay | Admitting: *Deleted

## 2016-01-11 DIAGNOSIS — I10 Essential (primary) hypertension: Secondary | ICD-10-CM

## 2016-01-11 MED ORDER — TRAZODONE HCL 300 MG PO TABS: 300.0000 mg | ORAL_TABLET | Freq: Every evening | ORAL | Status: DC | PRN

## 2016-01-11 MED ORDER — RISPERIDONE 0.5 MG PO TABS: 0.5000 mg | ORAL_TABLET | Freq: Every day | ORAL | Status: DC

## 2016-01-11 MED ORDER — AMLODIPINE BESYLATE 10 MG PO TABS: 10.0000 mg | ORAL_TABLET | Freq: Every day | ORAL | Status: DC

## 2016-01-11 MED ORDER — LEVOCETIRIZINE DIHYDROCHLORIDE 5 MG PO TABS: 5.0000 mg | ORAL_TABLET | Freq: Every evening | ORAL | Status: DC

## 2016-01-11 MED ORDER — METOPROLOL TARTRATE 100 MG PO TABS: 100.0000 mg | ORAL_TABLET | Freq: Two times a day (BID) | ORAL | Status: DC

## 2016-01-11 MED ORDER — MELOXICAM 15 MG PO TABS: 15.0000 mg | ORAL_TABLET | Freq: Every day | ORAL | Status: DC

## 2016-01-11 MED ORDER — ROSUVASTATIN CALCIUM 10 MG PO TABS: 10.0000 mg | ORAL_TABLET | Freq: Every day | ORAL | Status: DC

## 2016-01-11 MED ORDER — LISINOPRIL-HYDROCHLOROTHIAZIDE 20-25 MG PO TABS: 1.0000 | ORAL_TABLET | Freq: Every day | ORAL | Status: DC

## 2016-01-11 MED ORDER — NORTRIPTYLINE HCL 25 MG PO CAPS: 25.0000 mg | ORAL_CAPSULE | Freq: Three times a day (TID) | ORAL | Status: DC

## 2016-01-12 NOTE — Telephone Encounter (Signed)
Pt aware and has appt scheduled next week.

## 2016-01-16 ENCOUNTER — Ambulatory Visit (INDEPENDENT_AMBULATORY_CARE_PROVIDER_SITE_OTHER): Payer: Medicare Other | Admitting: Family Medicine

## 2016-01-16 ENCOUNTER — Encounter: Payer: Self-pay | Admitting: Family Medicine

## 2016-01-16 VITALS — BP 122/73 | HR 74 | Temp 97.2°F | Ht 70.0 in | Wt 219.4 lb

## 2016-01-16 DIAGNOSIS — R5382 Chronic fatigue, unspecified: Secondary | ICD-10-CM | POA: Diagnosis not present

## 2016-01-16 DIAGNOSIS — W57XXXA Bitten or stung by nonvenomous insect and other nonvenomous arthropods, initial encounter: Secondary | ICD-10-CM

## 2016-01-16 DIAGNOSIS — T148 Other injury of unspecified body region: Secondary | ICD-10-CM | POA: Diagnosis not present

## 2016-01-16 DIAGNOSIS — S3092XA Unspecified superficial injury of abdominal wall, initial encounter: Secondary | ICD-10-CM

## 2016-01-16 MED ORDER — DOXYCYCLINE HYCLATE 100 MG PO TABS: 100.0000 mg | ORAL_TABLET | Freq: Two times a day (BID) | ORAL | Status: DC

## 2016-01-16 NOTE — Progress Notes (Signed)
   HPI  Patient presents today here with take by fatigue.  Patient explains that he's been feeling fatigued and tired since his tick bite about a month ago. He felt slightly better while taking doxycycline Since that time he's had for additional tics round on him, one was embedded for 3 or 4 days on his abdominal fold.  He believes he gets them from his dogs who play around the river and water [place].  He states that he's had worsening fatigue, joint aches, myalgias, and low energy.  PMH: Smoking status noted ROS: Per HPI  Objective: BP 122/73 mmHg  Pulse 74  Temp(Src) 97.2 F (36.2 C) (Oral)  Ht '5\' 10"'$  (1.778 m)  Wt 219 lb 6.4 oz (99.519 kg)  BMI 31.48 kg/m2 Gen: NAD, alert, cooperative with exam HEENT: NCAT CV: RRR, good S1/S2, no murmur Resp: CTABL, no wheezes, non-labored Ext: No edema, warm Neuro: Alert and oriented, No gross deficits  Skin: Right lower abdominal fold with small erythematous papule, nonindurated, nontender, and no drainage present.  Assessment and plan:  # Tick bite Covered with doxycycline, note that he is on several courses of doxycycline since establishing care here. He was on doxycycline in November, December, June, and now again. We discussed appropriate use of antibiotics, drawing labs today Fatigue  could have separate source, however must consider erhlichiosis and other nontestable tickborne illnesses as well.  # Fatigue Non specific, worsening Considering tick borne illness as above Labs      Orders Placed This Encounter  Procedures  . Lyme Disease Abs IgG, IgM, IFA, CSF  . [place] mtn spotted fvr abs pnl(IgG+IgM)  . CBC with Differential  . TSH  . CMP14+EGFR    Meds ordered this encounter  Medications  . doxycycline (VIBRA-TABS) 100 MG tablet    Sig: Take 1 tablet (100 mg total) by mouth 2 (two) times daily. 1 po bid    Dispense:  28 tablet    Refill:  0    Laroy Apple, MD [facility] 01/16/2016, 11:25 AM

## 2016-01-16 NOTE — Patient Instructions (Signed)
Great to see you!  We will call with results within 1-2 weeks (lyme tests take longer to come back sometimes)

## 2016-01-18 LAB — ROCKY MTN SPOTTED FVR ABS PNL(IGG+IGM)
RMSF IgG: NEGATIVE
RMSF IgM: 0.26 index (ref 0.00–0.89)

## 2016-01-18 LAB — LYME AB/WESTERN BLOT REFLEX: LYME DISEASE AB, QUANT, IGM: <0.8 index (ref 0.00–0.79)

## 2016-01-18 LAB — CMP14+EGFR
ALBUMIN: 4.1 g/dL (ref 3.6–4.8)
ALK PHOS: 51 IU/L (ref 39–117)
ALT: 7 IU/L (ref 0–44)
AST: 21 IU/L (ref 0–40)
Albumin/Globulin Ratio: 1.9 (ref 1.2–2.2)
BILIRUBIN TOTAL: 0.2 mg/dL (ref 0.0–1.2)
BUN / CREAT RATIO: 14 (ref 10–24)
BUN: 17 mg/dL (ref 8–27)
CHLORIDE: 100 mmol/L (ref 96–106)
CO2: 21 mmol/L (ref 18–29)
Calcium: 9.5 mg/dL (ref 8.6–10.2)
Creatinine, Ser: 1.21 mg/dL (ref 0.76–1.27)
GFR calc Af Amer: 75 mL/min/{1.73_m2} (ref >59)
GFR calc non Af Amer: 65 mL/min/{1.73_m2} (ref >59)
GLOBULIN, TOTAL: 2.2 g/dL (ref 1.5–4.5)
Glucose: 125 mg/dL — ABNORMAL HIGH (ref 65–99)
POTASSIUM: 3.9 mmol/L (ref 3.5–5.2)
SODIUM: 139 mmol/L (ref 134–144)
Total Protein: 6.3 g/dL (ref 6.0–8.5)

## 2016-01-18 LAB — CBC WITH DIFFERENTIAL/PLATELET
BASOS: 0 %
Basophils Absolute: 0 10*3/uL (ref 0.0–0.2)
EOS (ABSOLUTE): 0.1 10*3/uL (ref 0.0–0.4)
EOS: 2 %
Hematocrit: 38.4 % (ref 37.5–51.0)
Hemoglobin: 13.2 g/dL (ref 12.6–17.7)
Immature Grans (Abs): 0 10*3/uL (ref 0.0–0.1)
Immature Granulocytes: 0 %
LYMPHS ABS: 1.5 10*3/uL (ref 0.7–3.1)
Lymphs: 21 %
MCH: 30.3 pg (ref 26.6–33.0)
MCHC: 34.4 g/dL (ref 31.5–35.7)
MCV: 88 fL (ref 79–97)
MONOS ABS: 0.6 10*3/uL (ref 0.1–0.9)
Monocytes: 9 %
Neutrophils Absolute: 4.9 10*3/uL (ref 1.4–7.0)
Neutrophils: 68 %
Platelets: 255 10*3/uL (ref 150–379)
RBC: 4.35 x10E6/uL (ref 4.14–5.80)
RDW: 13.1 % (ref 12.3–15.4)
WBC: 7.2 10*3/uL (ref 3.4–10.8)

## 2016-01-18 LAB — TSH: TSH: 1.57 u[IU]/mL (ref 0.450–4.500)

## 2016-01-22 ENCOUNTER — Encounter: Payer: Self-pay | Admitting: Family Medicine

## 2016-01-22 ENCOUNTER — Ambulatory Visit (INDEPENDENT_AMBULATORY_CARE_PROVIDER_SITE_OTHER): Payer: Medicare Other | Admitting: Family Medicine

## 2016-01-22 VITALS — BP 133/84 | HR 68 | Temp 96.8°F | Ht 70.0 in | Wt 217.8 lb

## 2016-01-22 DIAGNOSIS — M2560 Stiffness of unspecified joint, not elsewhere classified: Secondary | ICD-10-CM

## 2016-01-22 DIAGNOSIS — N529 Male erectile dysfunction, unspecified: Secondary | ICD-10-CM

## 2016-01-22 DIAGNOSIS — E785 Hyperlipidemia, unspecified: Secondary | ICD-10-CM | POA: Diagnosis not present

## 2016-01-22 DIAGNOSIS — F331 Major depressive disorder, recurrent, moderate: Secondary | ICD-10-CM

## 2016-01-22 NOTE — Progress Notes (Signed)
   HPI  Patient presents today here for follow-up of hyperlipidemia, fatigue, muscle aches and pains, and erectile dysfunction.  Hyperlipidemia Reports very good medication compliance He does not watch his diet very closely He has turned exercising although only one week ago. He is doing 30-40 minutes of cardio 3-4 times a week.  Erectile dysfunction Barely helped by Viagra. He is concerned that wrist but all his contributing.  Fatigue and joint stiffness Reports long history of intermittent fatigue and joint stiffness. After chart review it looks like he complained of this on her first visit about 9 months ago. We stopped Lipitor and did have some improvement transiently. He has not had as good results with Crestor. Reports joint stiffness that lasts more than 2 hours every morning. Has diffuse muscle aches and pains.  Doxycycline has not improved his symptoms.  Depression, reported bipolar disorder Improved on Risperdal Has had increased fatigue since that time.  PMH: Smoking status noted ROS: Per HPI  Objective: BP 133/84   Pulse 68   Temp (!) 96.8 F (36 C) (Oral)   Ht 5\' 10"  (1.778 m)   Wt 217 lb 12.8 oz (98.8 kg)   BMI 31.25 kg/m  Gen: NAD, alert, cooperative with exam HEENT: NCAT CV: RRR, good S1/S2, no murmur Resp: CTABL, no wheezes, non-labored Ext: No edema, warm Neuro: Alert and oriented, No gross deficits  Psych: Appropriate mood and affect  Assessment and plan:  # Hyperlipidemia Rechecking labs today Reports good compliance May need to change back to Lipitor, there is some question of myalgias caused by statin Consider one month off of statins as a trial.  # Joint stiffness, muscle aches Checking labs to rule out rheumatologic disorders, also considering statin-induced myalgia, check CK Consider statin vacation  # Depression, bipolar disorder Referring psychiatry He's very stable and states that his mood has improved quite a lot on  Risperdal, however he's having side effects of erectile dysfunction and it's unclear whether or not it's contributing to fatigue. I considered changing to [facility], however think the best option for him is evaluation and treatment by psychiatry.  # Erectile dysfunction Viagra sample given today. Helped by viagra, however there is some concern that Risperdal may be contributing.    Orders Placed This Encounter  Procedures  . Sedimentation rate  . ANA, IFA Comprehensive Panel  . Lipid Panel  . CK  . Ambulatory referral to Psychiatry    Referral Priority:   Routine    Referral Type:   Psychiatric    Referral Reason:   Specialty Services Required    Requested Specialty:   Psychiatry    Number of Visits Requested:   [place], MD [facility] 01/22/2016, 11:07 AM

## 2016-01-22 NOTE — Patient Instructions (Addendum)
Great to see you!  Continue your cholesterol medicines as is for now.   We will call with lab results within 1 week  Call for a psychiatry appointment

## 2016-01-23 LAB — ANA COMPREHENSIVE PANEL
Centromere Ab Screen: <0.2 AI (ref 0.0–0.9)
DSDNA AB: 1 [IU]/mL (ref 0–9)
ENA RNP Ab: 0.2 AI (ref 0.0–0.9)
ENA SSA (RO) Ab: <0.2 AI (ref 0.0–0.9)
ENA SSB (LA) Ab: <0.2 AI (ref 0.0–0.9)
Scleroderma SCL-70: <0.2 AI (ref 0.0–0.9)

## 2016-01-23 LAB — LIPID PANEL
CHOLESTEROL TOTAL: 201 mg/dL — AB (ref 100–199)
Chol/HDL Ratio: 4.4 ratio units (ref 0.0–5.0)
HDL: 46 mg/dL (ref >39)
LDL CALC: 96 mg/dL (ref 0–99)
TRIGLYCERIDES: 296 mg/dL — AB (ref 0–149)
VLDL Cholesterol Cal: 59 mg/dL — ABNORMAL HIGH (ref 5–40)

## 2016-01-23 LAB — CK: Total CK: 93 U/L (ref 24–204)

## 2016-01-23 LAB — SEDIMENTATION RATE: SED RATE: 16 mm/h (ref 0–30)

## 2016-01-26 ENCOUNTER — Telehealth: Payer: Self-pay | Admitting: Family Medicine

## 2016-01-26 NOTE — Telephone Encounter (Signed)
Patient aware of results and he was fasting when he had his labs,

## 2016-02-13 ENCOUNTER — Telehealth: Payer: Self-pay | Admitting: Family Medicine

## 2016-02-13 NOTE — Telephone Encounter (Signed)
Triglycerides are elevated, to a moderate level. At this level it does not have to be treated, he was not fasting on the day of the lab tests.  I would only recommend taking fish oil, 1 pill, twice daily.   I would recommend repeat testing with fasting labs, diet and exercise interventions like regular aerobic exercise 30 minutes 4 times a week, and reducing fried, fatty, and high carbohydrate foods.  No other prescriptions are required at this time.   Laroy Apple, MD [facility] 02/13/2016, 5:58 PM

## 2016-02-14 ENCOUNTER — Telehealth: Payer: Self-pay | Admitting: Family Medicine

## 2016-02-16 ENCOUNTER — Other Ambulatory Visit: Payer: Self-pay | Admitting: Family Medicine

## 2016-02-16 DIAGNOSIS — I10 Essential (primary) hypertension: Secondary | ICD-10-CM

## 2016-02-27 ENCOUNTER — Institutional Professional Consult (permissible substitution): Payer: Medicare Other | Admitting: Pulmonary Disease

## 2016-02-29 ENCOUNTER — Other Ambulatory Visit: Payer: Self-pay | Admitting: Family Medicine

## 2016-02-29 NOTE — Telephone Encounter (Signed)
Pt requesting 90 day supply on Meloxicam to be sent to [facility], ok to fill?

## 2016-03-05 NOTE — Telephone Encounter (Signed)
Letter with details and notes from provider mailed along with lab results.  Patient to call with any concerns or questions.

## 2016-03-05 NOTE — Telephone Encounter (Signed)
Lab results and information from provider mailed to patient.

## 2016-03-08 ENCOUNTER — Other Ambulatory Visit: Payer: Self-pay | Admitting: *Deleted

## 2016-03-08 MED ORDER — LEVOCETIRIZINE DIHYDROCHLORIDE 5 MG PO TABS
5.0000 mg | ORAL_TABLET | Freq: Every evening | ORAL | 1 refills | Status: DC
Start: 2016-03-08 — End: 2016-09-10

## 2016-03-14 ENCOUNTER — Encounter: Payer: Self-pay | Admitting: Pharmacist

## 2016-03-14 ENCOUNTER — Ambulatory Visit (INDEPENDENT_AMBULATORY_CARE_PROVIDER_SITE_OTHER): Payer: Medicare Other | Admitting: Pharmacist

## 2016-03-14 VITALS — BP 132/88 | HR 70 | Ht 70.0 in | Wt 220.0 lb

## 2016-03-14 DIAGNOSIS — I10 Essential (primary) hypertension: Secondary | ICD-10-CM

## 2016-03-14 DIAGNOSIS — R7309 Other abnormal glucose: Secondary | ICD-10-CM | POA: Diagnosis not present

## 2016-03-14 DIAGNOSIS — Z0000 Encounter for general adult medical examination without abnormal findings: Secondary | ICD-10-CM | POA: Diagnosis not present

## 2016-03-14 LAB — GLUCOSE HEMOCUE WAIVED: Glu Hemocue Waived: 107 mg/dL — ABNORMAL HIGH (ref 65–99)

## 2016-03-14 LAB — BAYER DCA HB A1C WAIVED: HB A1C (BAYER DCA - WAIVED): 5.3 % (ref <7.0)

## 2016-03-14 MED ORDER — METOPROLOL TARTRATE 100 MG PO TABS: 50.0000 mg | ORAL_TABLET | Freq: Two times a day (BID) | ORAL | 4 refills | Status: DC

## 2016-03-14 NOTE — Progress Notes (Signed)
Patient ID: David Simon, male   DOB: 02-20-1956, 60 y.o.   MRN: [id number]     Subjective:   David Simon is a 60 y.o. male who presents for an Initial Medicare Annual Wellness Visit.  Mr. Brutsche is divorced white male.  He lives with his girlfriend and her 2 children.  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  He reports that he was injured several times of the many years in work related accidents.  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   His main health concern is chronic pain.  He was recently seen by orthopedic and received steroid injection which he states have not helped much.  He also c/o feeling fatigued.  He thinks this is related to high metoprolol dose.  He also wonders if metoprolol is worsening his ED.  He has had cardio w/u recently which did not show CHF or other CAD.    Current Medications (verified) Outpatient Encounter Prescriptions as of 03/14/2016  Medication Sig  . amLODipine (NORVASC) 10 MG tablet Take 1 tablet (10 mg total) by mouth daily.  Marland Kitchen atorvastatin (LIPITOR) 10 MG tablet Take 10 mg by mouth daily.  . DULoxetine (CYMBALTA) 60 MG capsule Take 1 capsule by mouth at bedtime.  Marland Kitchen levocetirizine (XYZAL) 5 MG tablet Take 1 tablet (5 mg total) by mouth every evening.  Marland Kitchen lisinopril-hydrochlorothiazide (PRINZIDE,ZESTORETIC) 20-25 MG tablet Take 1 tablet by mouth daily.  . meloxicam (MOBIC) 15 MG tablet Take 1 tablet by mouth  daily  . metoprolol (LOPRESSOR) 100 MG tablet TAKE ONE TABLET BY MOUTH TWICE DAILY  . risperiDONE (RISPERDAL) 0.5 MG tablet Take 1 tablet (0.5 mg total) by mouth at bedtime.  . sildenafil (REVATIO) 20 MG tablet TAKE 2 TO 5 TABLETS BY MOUTH DAILY AS NEEDED FOR ERECTILE DYSFUNCTION.  . tadalafil (CIALIS) 5 MG tablet Take 1 tablet (5 mg total) by mouth daily as needed for erectile dysfunction.  . trazodone (DESYREL) 300 MG tablet Take 1 tablet (300 mg  total) by mouth at bedtime as needed for sleep.  . [DISCONTINUED] metoprolol (LOPRESSOR) 100 MG tablet Take 1 tablet (100 mg total) by mouth 2 (two) times daily.  . fluticasone (FLONASE) 50 MCG/ACT nasal spray Place into both nostrils daily.  . [DISCONTINUED] cyclobenzaprine (FLEXERIL) 10 MG tablet Take 1 tablet (10 mg total) by mouth 3 (three) times daily as needed for muscle spasms. (Patient not taking: Reported on 03/14/2016)  . [DISCONTINUED] doxycycline (VIBRA-TABS) 100 MG tablet Take 1 tablet (100 mg total) by mouth 2 (two) times daily. 1 po bid (Patient not taking: Reported on 03/14/2016)  . [DISCONTINUED] nortriptyline (PAMELOR) 25 MG capsule Take 1 capsule (25 mg total) by mouth 3 (three) times daily. (Patient not taking: Reported on 03/14/2016)  . [DISCONTINUED] rosuvastatin (CRESTOR) 10 MG tablet Take 1 tablet (10 mg total) by mouth daily. (Patient not taking: Reported on 03/14/2016)   No facility-administered encounter medications on file as of 03/14/2016.     Allergies (verified) Other   History: Past Medical History:  Diagnosis Date  . Allergy   . Anxiety   . Arthritis   . Asthma   . Depression   . Essential hypertension   . Fracture of neck ([place])    Traumatic event 2008  . History of diabetes mellitus   . Hypercholesterolemia   . Seasonal allergies   . Spinal stenosis  Past Surgical History:  Procedure Laterality Date  . CARPAL TUNNEL RELEASE    . HIP SURGERY    . NASAL SINUS SURGERY     Family History  Problem Relation Age of Onset  . Hypertension Mother   . High Cholesterol Mother   . Heart disease Father    Social History   Occupational History  . Not on file.   Social History Main Topics  . Smoking status: Current Some Day Smoker    Packs/day: 0.00    Types: Cigars  . Smokeless tobacco: Never Used  . Alcohol use 0.0 oz/week     Comment: very seldom  . Drug use: No  . Sexual activity: Yes    Do you feel safe at home?  Yes Are there smokers in  your home (other than you)? No  Dietary issues and exercise activities discussed: Current Exercise Habits: Structured exercise class, Time (Minutes): 30, Frequency (Times/Week): 4, Weekly Exercise (Minutes/Week): 120, Intensity: Moderate  Current Dietary habits:  He avoids processed foods.  Cooks most of famiy's meals himself.    Cardiac Risk Factors include: advanced age (>4men, >75 women);dyslipidemia;family history of premature cardiovascular disease;hypertension;male gender;obesity (BMI >30kg/m2);sedentary lifestyle;smoking/ tobacco exposure  Objective:    Today's Vitals   03/14/16 1155  BP: 132/88  Pulse: 70  Weight: 220 lb (99.8 kg)  Height: 5\' 10"  (1.778 m)  PainSc: 8   PainLoc: Generalized   Body mass index is 31.57 kg/m.   FBG was 125 (01/16/2016) Rechecked today was 107 A1c was 5.3% Tg = 296 (01/16/2016)   Activities of Daily Living In your present state of health, do you have any difficulty performing the following activities: 03/14/2016  Hearing? N  Vision? N  Difficulty concentrating or making decisions? N  Walking or climbing stairs? N  Dressing or bathing? N  Doing errands, shopping? N  Preparing Food and eating ? N  Using the Toilet? N  In the past six months, have you accidently leaked urine? N  Do you have problems with loss of bowel control? N  Managing your Medications? N  Managing your Finances? N  Housekeeping or managing your Housekeeping? N     Depression Screen PHQ 2/9 Scores 03/14/2016 01/22/2016 01/16/2016 12/08/2015  PHQ - 2 Score 2 0 0 0  PHQ- 9 Score 6 - - -     Fall Risk Fall Risk  03/14/2016 01/22/2016 01/16/2016 12/08/2015 08/29/2015  Falls in the past year? No No No No No    Cognitive Function: No flowsheet data found.  Immunizations and Health Maintenance Immunization History  Administered Date(s) Administered  . Influenza-Unspecified 03/01/2016   Health Maintenance Due  Topic Date Due  . HIV Screening  07/11/1970  .  TETANUS/TDAP  07/11/1974  . ZOSTAVAX  07/12/2015    Patient Care Team: Timmothy Euler, MD as PCP - General (Family Medicine)  Indicate any recent Medical Services you may have received from other than [facility] providers in the past year (date may be approximate).    Assessment:    Annual Wellness Visit  Elevated Tg and FBG HTN Fatigue / ED - possibly worsened by beta blocker   Screening Tests Health Maintenance  Topic Date Due  . HIV Screening  07/11/1970  . TETANUS/TDAP  07/11/1974  . ZOSTAVAX  07/12/2015  . COLON CANCER SCREENING ANNUAL FOBT  06/07/2016  . COLONOSCOPY  11/30/2016  . INFLUENZA VACCINE  Completed  . Hepatitis C Screening  Completed        Plan:  During the course of the visit Akira was educated and counseled about the following appropriate screening and preventive services:   Vaccines to include Pneumoccal, Influenza,  Td, Zostavax - patient declined Td and Zostavax due to cost.  Received influenza vaccines 03/01/16 form [facility]  Colorectal cancer screening - UTD - due recheck next year  Cardiovascular disease screening - UTD  HTN / Fatigue - decrease metoprolol to 50mg  bid (1/2 tablet bid)  Patient to check BP and HR daily.  Call office if BP increases to over 140/90  Diabetes screening - FBG elevated 12/2015. Ac1 chcked today and WNL  Glaucoma screening / Eye Exam - needed.  Reminded patient to make appt  Nutrition counseling - discussed limiting sugar and other high CHO foods to help with weight and prevent DM.    PHysical Activity - continue to work out in gym - this will help with weight and prevent DM.   Prostate cancer screening - UTD  Smoking cessation counseling - declined  Advanced Directives - info packet provided.  F/U with PCP regarding chronic pain and BP in 1 month  Orders Placed This Encounter  Procedures  . Glucose Hemocue Waived  . Bayer DCA Hb A1c Waived    Patient Instructions (the written plan) were given to the patient.     Cherre Robins, PharmD   03/14/2016

## 2016-03-14 NOTE — Patient Instructions (Addendum)
David Simon , Thank you for taking time to come for your Medicare Wellness Visit. I appreciate your ongoing commitment to your health goals. Please review the following plan we discussed and let me know if I can assist you in the future.   These are the goals we discussed:  Trial of lower metoprolol dose to see if fatigue improves.  Change to metoprolol 100mg  - take 1/2 tablet twice a day.  Check blood pressure 1-2 times a day.  If you start to get readings over 140 / 90 or heart rate over 90 then call office for adjustment in blood pressure medications.  Reschedule sleep study - sleep apnea can affect energy level  Continue to exercise as tolerate - goal is at least 150 minutes each week   This is a list of the screening recommended for you and due dates:  Health Maintenance  Topic Date Due  . HIV Screening  07/11/1970 -deferred  . Tetanus Vaccine  07/11/1974 - will verify insurance coverage and get back to you   . Shingles Vaccine  07/12/2015 - will verify insurance coverage and get back to you  . Colon Cancer Screening  11/30/2016  . Flu Shot  Completed - next due Fall 2018  .  Hepatitis C: One time screening is recommended by Center for Disease Control  (CDC) for  adults born from [birth year] through 1965.   Completed    Health Maintenance, Male A healthy lifestyle and preventative care can promote health and wellness.  Maintain regular health, dental, and eye exams.  Eat a healthy diet. Foods like vegetables, fruits, whole grains, low-fat dairy products, and lean protein foods contain the nutrients you need and are low in calories. Decrease your intake of foods high in solid fats, added sugars, and salt. Get information about a proper diet from your health care provider, if necessary.  Regular physical exercise is one of the most important things you can do for your health. Most adults should get at least 150 minutes of moderate-intensity exercise (any activity that increases your heart  rate and causes you to sweat) each week. In addition, most adults need muscle-strengthening exercises on 2 or more days a week.   Maintain a healthy weight. The body mass index (BMI) is a screening tool to identify possible weight problems. It provides an estimate of body fat based on height and weight. Your health care provider can find your BMI and can help you achieve or maintain a healthy weight. For males 20 years and older:  A BMI below 18.5 is considered underweight.  A BMI of 18.5 to 24.9 is normal.  A BMI of 25 to 29.9 is considered overweight.  A BMI of 30 and above is considered obese.  Maintain normal blood lipids and cholesterol by exercising and minimizing your intake of saturated fat. Eat a balanced diet with plenty of fruits and vegetables. Blood tests for lipids and cholesterol should begin at age 33 and be repeated every 5 years. If your lipid or cholesterol levels are high, you are over age 76, or you are at high risk for heart disease, you may need your cholesterol levels checked more frequently.Ongoing high lipid and cholesterol levels should be treated with medicines if diet and exercise are not working.  If you smoke, find out from your health care provider how to quit. If you do not use tobacco, do not start.  Lung cancer screening is recommended for adults aged 44-80 years who are at high risk  for developing lung cancer because of a history of smoking. A yearly low-dose CT scan of the lungs is recommended for people who have at least a 30-pack-year history of smoking and are current smokers or have quit within the past 15 years. A pack year of smoking is smoking an average of 1 pack of cigarettes a day for 1 year (for example, a 30-pack-year history of smoking could mean smoking 1 pack a day for 30 years or 2 packs a day for 15 years). Yearly screening should continue until the smoker has stopped smoking for at least 15 years. Yearly screening should be stopped for people  who develop a health problem that would prevent them from having lung cancer treatment.  If you choose to drink alcohol, do not have more than 2 drinks per day. One drink is considered to be 12 oz (360 mL) of beer, 5 oz (150 mL) of wine, or 1.5 oz (45 mL) of liquor.  Avoid the use of street drugs. Do not share needles with anyone. Ask for help if you need support or instructions about stopping the use of drugs.  High blood pressure causes heart disease and increases the risk of stroke. High blood pressure is more likely to develop in:  People who have blood pressure in the end of the normal range (100-139/85-89 mm Hg).  People who are overweight or obese.  People who are African American.  If you are 3518-60 years of age, have your blood pressure checked every 3-5 years. If you are 60 years of age or older, have your blood pressure checked every year. You should have your blood pressure measured twice--once when you are at a hospital or clinic, and once when you are not at a hospital or clinic. Record the average of the two measurements. To check your blood pressure when you are not at a hospital or clinic, you can use:  An automated blood pressure machine at a pharmacy.  A home blood pressure monitor.  If you are 6145-60 years old, ask your health care provider if you should take aspirin to prevent heart disease.  Diabetes screening involves taking a blood sample to check your fasting blood sugar level. This should be done once every 3 years after age 60 if you are at a normal weight and without risk factors for diabetes. Testing should be considered at a younger age or be carried out more frequently if you are overweight and have at least 1 risk factor for diabetes.  Colorectal cancer can be detected and often prevented. Most routine colorectal cancer screening begins at the age of 60 and continues through age 60. However, your health care provider may recommend screening at an earlier age if  you have risk factors for colon cancer. On a yearly basis, your health care provider may provide home test kits to check for hidden blood in the stool. A small camera at the end of a tube may be used to directly examine the colon (sigmoidoscopy or colonoscopy) to detect the earliest forms of colorectal cancer. Talk to your health care provider about this at age 60 when routine screening begins. A direct exam of the colon should be repeated every 5-10 years through age 60, unless early forms of precancerous polyps or small growths are found.  People who are at an increased risk for hepatitis B should be screened for this virus. You are considered at high risk for hepatitis B if:  You were born in a country where  hepatitis B occurs often. Talk with your health care provider about which countries are considered high risk.  Your parents were born in a high-risk country and you have not received a shot to protect against hepatitis B (hepatitis B vaccine).  You have HIV or AIDS.  You use needles to inject street drugs.  You live with, or have sex with, someone who has hepatitis B.  You are a man who has sex with other men (MSM).  You get hemodialysis treatment.  You take certain medicines for conditions like cancer, organ transplantation, and autoimmune conditions.  Hepatitis C blood testing is recommended for all people born from [birth year] through 1965 and any individual with known risk factors for hepatitis C.  Healthy men should no longer receive prostate-specific antigen (PSA) blood tests as part of routine cancer screening. Talk to your health care provider about prostate cancer screening.  Testicular cancer screening is not recommended for adolescents or adult males who have no symptoms. Screening includes self-exam, a health care provider exam, and other screening tests. Consult with your health care provider about any symptoms you have or any concerns you have about testicular  cancer.  Practice safe sex. Use condoms and avoid high-risk sexual practices to reduce the spread of sexually transmitted infections (STIs).  You should be screened for STIs, including gonorrhea and chlamydia if:  You are sexually active and are younger than 24 years.  You are older than 24 years, and your health care provider tells you that you are at risk for this type of infection.  Your sexual activity has changed since you were last screened, and you are at an increased risk for chlamydia or gonorrhea. Ask your health care provider if you are at risk.  If you are at risk of being infected with HIV, it is recommended that you take a prescription medicine daily to prevent HIV infection. This is called pre-exposure prophylaxis (PrEP). You are considered at risk if:  You are a man who has sex with other men (MSM).  You are a heterosexual man who is sexually active with multiple partners.  You take drugs by injection.  You are sexually active with a partner who has HIV.  Talk with your health care provider about whether you are at high risk of being infected with HIV. If you choose to begin PrEP, you should first be tested for HIV. You should then be tested every 3 months for as long as you are taking PrEP.  Use sunscreen. Apply sunscreen liberally and repeatedly throughout the day. You should seek shade when your shadow is shorter than you. Protect yourself by wearing long sleeves, pants, a wide-brimmed hat, and sunglasses year round whenever you are outdoors.  Tell your health care provider of new moles or changes in moles, especially if there is a change in shape or color. Also, tell your health care provider if a mole is larger than the size of a pencil eraser.  A one-time screening for abdominal aortic aneurysm (AAA) and surgical repair of large AAAs by ultrasound is recommended for men aged 34-75 years who are current or former smokers.  Stay current with your vaccines  (immunizations).   This information is not intended to replace advice given to you by your health care provider. Make sure you discuss any questions you have with your health care provider.   Document Released: 12/14/2007 Document Revised: 07/08/2014 Document Reviewed: 11/12/2010 Elsevier Interactive Patient Education [employer].

## 2016-03-17 ENCOUNTER — Other Ambulatory Visit: Payer: Self-pay | Admitting: Family Medicine

## 2016-03-21 ENCOUNTER — Other Ambulatory Visit: Payer: Self-pay | Admitting: Family Medicine

## 2016-03-25 ENCOUNTER — Telehealth: Payer: Self-pay | Admitting: Family Medicine

## 2016-03-25 MED ORDER — SILDENAFIL CITRATE 20 MG PO TABS: ORAL_TABLET | ORAL | 0 refills | Status: DC

## 2016-03-25 NOTE — Telephone Encounter (Signed)
Dr Wendi Snipes PCP  This med not on list of meds  PLe[place] address

## 2016-03-25 NOTE — Telephone Encounter (Signed)
Refilled revatio  Laroy Apple, MD [facility] 03/25/2016, 5:03 PM

## 2016-03-26 ENCOUNTER — Other Ambulatory Visit: Payer: Self-pay

## 2016-03-26 ENCOUNTER — Other Ambulatory Visit: Payer: Self-pay | Admitting: Family Medicine

## 2016-03-26 MED ORDER — SILDENAFIL CITRATE 100 MG PO TABS: 50.0000 mg | ORAL_TABLET | Freq: Every day | ORAL | 3 refills | Status: DC | PRN

## 2016-03-26 MED ORDER — RISPERIDONE 0.5 MG PO TABS: 0.5000 mg | ORAL_TABLET | Freq: Every day | ORAL | 1 refills | Status: DC

## 2016-03-26 NOTE — Telephone Encounter (Signed)
Patient aware.

## 2016-03-26 NOTE — Telephone Encounter (Signed)
Rx sent.   Laroy Apple, MD [facility] 03/26/2016, 12:00 PM

## 2016-04-16 ENCOUNTER — Ambulatory Visit: Payer: Self-pay | Admitting: Family Medicine

## 2016-04-30 ENCOUNTER — Ambulatory Visit (INDEPENDENT_AMBULATORY_CARE_PROVIDER_SITE_OTHER): Payer: Medicare Other | Admitting: Family Medicine

## 2016-04-30 ENCOUNTER — Encounter: Payer: Self-pay | Admitting: Family Medicine

## 2016-04-30 VITALS — BP 137/87 | HR 79 | Temp 97.5°F | Ht 70.0 in | Wt 234.0 lb

## 2016-04-30 DIAGNOSIS — N529 Male erectile dysfunction, unspecified: Secondary | ICD-10-CM | POA: Diagnosis not present

## 2016-04-30 DIAGNOSIS — I10 Essential (primary) hypertension: Secondary | ICD-10-CM | POA: Diagnosis not present

## 2016-04-30 DIAGNOSIS — K219 Gastro-esophageal reflux disease without esophagitis: Secondary | ICD-10-CM | POA: Diagnosis not present

## 2016-04-30 MED ORDER — PANTOPRAZOLE SODIUM 40 MG PO TBEC: 40.0000 mg | DELAYED_RELEASE_TABLET | Freq: Every day | ORAL | 3 refills | Status: DC

## 2016-04-30 NOTE — Patient Instructions (Signed)
Great to see you!  Start Protonix 1 pill once daily for 3 months, We will see how you do without it or on zantac as needed after that.   Losing weight could help a lot.

## 2016-04-30 NOTE — Progress Notes (Signed)
   HPI  Patient presents today here for follow-up blood pressure, erectile dysfunction, and GERD symptoms.  GERD Patient states over the last month or so he's had intermittent nausea associated with severe heartburn. He states she's gained a little weight and since that time he has had severe GERD especially when laying down at night. The nausea is always associated with. He denies any vomiting.  Hypertension He's decreased his metoprolol one half pill twice daily and his amlodipine to one half pill daily. No chest pain, average blood pressure at home is [id number] systolic  Erectile dysfunction Helped by Viagra, request sample today   PMH: Smoking status noted ROS: Per HPI  Objective: BP 137/87   Pulse 79   Temp 97.5 F (36.4 C) (Oral)   Ht 5\' 10"  (1.778 m)   Wt 234 lb (106.1 kg)   BMI 33.58 kg/m  Gen: NAD, alert, cooperative with exam HEENT: NCAT CV: RRR, good S1/S2, no murmur Resp: CTABL, no wheezes, non-labored Abdomen: Soft, nontender, no guarding, positive bowel sounds Ext: No edema, warm Neuro: Alert and oriented, No gross deficits  Assessment and plan:  # GERD New-onset symptoms With associated nausea Helped by H2 blockers but not completely resolve Treat with PPI daily 3 months Consider testing for H. pylori  # Hypertension Controlled on some decreased medications Continue 5 mg of amlodipine, 25 mg metoprolol twice daily Continue Prinzide  # Erectile dysfunction Helped by Viagra Sample given today    Meds ordered this encounter  Medications  . pantoprazole (PROTONIX) 40 MG tablet    Sig: Take 1 tablet (40 mg total) by mouth daily.    Dispense:  30 tablet    Refill:  [place], MD [place] 04/30/2016, 11:25 AM

## 2016-05-06 ENCOUNTER — Other Ambulatory Visit: Payer: Self-pay | Admitting: Family Medicine

## 2016-05-06 ENCOUNTER — Telehealth: Payer: Self-pay | Admitting: Family Medicine

## 2016-05-06 MED ORDER — DULOXETINE HCL 60 MG PO CPEP: 60.0000 mg | ORAL_CAPSULE | Freq: Every day | ORAL | 2 refills | Status: DC

## 2016-05-06 NOTE — Telephone Encounter (Signed)
done

## 2016-05-08 ENCOUNTER — Other Ambulatory Visit: Payer: Self-pay | Admitting: Family Medicine

## 2016-05-15 ENCOUNTER — Other Ambulatory Visit: Payer: Self-pay | Admitting: *Deleted

## 2016-05-15 DIAGNOSIS — I10 Essential (primary) hypertension: Secondary | ICD-10-CM

## 2016-05-15 MED ORDER — LISINOPRIL-HYDROCHLOROTHIAZIDE 20-25 MG PO TABS: 1.0000 | ORAL_TABLET | Freq: Every day | ORAL | 0 refills | Status: DC

## 2016-05-25 ENCOUNTER — Other Ambulatory Visit: Payer: Self-pay | Admitting: Family Medicine

## 2016-05-27 ENCOUNTER — Other Ambulatory Visit: Payer: Self-pay | Admitting: Family Medicine

## 2016-06-03 ENCOUNTER — Ambulatory Visit: Payer: Medicare Other | Admitting: Family Medicine

## 2016-06-04 ENCOUNTER — Encounter: Payer: Self-pay | Admitting: Family Medicine

## 2016-06-12 ENCOUNTER — Other Ambulatory Visit: Payer: Self-pay | Admitting: Family Medicine

## 2016-06-13 ENCOUNTER — Encounter: Payer: Self-pay | Admitting: Family Medicine

## 2016-06-13 ENCOUNTER — Ambulatory Visit (INDEPENDENT_AMBULATORY_CARE_PROVIDER_SITE_OTHER): Payer: Medicare Other | Admitting: Family Medicine

## 2016-06-13 VITALS — BP 131/86 | HR 64 | Temp 97.0°F | Ht 70.0 in | Wt 242.8 lb

## 2016-06-13 DIAGNOSIS — Z720 Tobacco use: Secondary | ICD-10-CM | POA: Diagnosis not present

## 2016-06-13 DIAGNOSIS — J0100 Acute maxillary sinusitis, unspecified: Secondary | ICD-10-CM

## 2016-06-13 DIAGNOSIS — N529 Male erectile dysfunction, unspecified: Secondary | ICD-10-CM | POA: Diagnosis not present

## 2016-06-13 MED ORDER — SILDENAFIL CITRATE 100 MG PO TABS: 50.0000 mg | ORAL_TABLET | Freq: Every day | ORAL | 0 refills | Status: DC | PRN

## 2016-06-13 MED ORDER — AZITHROMYCIN 250 MG PO TABS: ORAL_TABLET | ORAL | 0 refills | Status: DC

## 2016-06-13 NOTE — Patient Instructions (Addendum)
Great to see you!  Try to wait until Sunday to start azithromycin, if you are not getting a little better by then go ahead and start   Sinusitis, Adult Sinusitis is soreness and inflammation of your sinuses. Sinuses are hollow spaces in the bones around your face. They are located:  Around your eyes.  In the middle of your forehead.  Behind your nose.  In your cheekbones. Your sinuses and nasal passages are lined with a stringy fluid (mucus). Mucus normally drains out of your sinuses. When your nasal tissues get inflamed or swollen, the mucus can get trapped or blocked so air cannot flow through your sinuses. This lets bacteria, viruses, and funguses grow, and that leads to infection. Follow these instructions at home: Medicines  Take, use, or apply over-the-counter and prescription medicines only as told by your doctor. These may include nasal sprays.  If you were prescribed an antibiotic medicine, take it as told by your doctor. Do not stop taking the antibiotic even if you start to feel better. Hydrate and Humidify  Drink enough water to keep your pee (urine) clear or pale yellow.  Use a cool mist humidifier to keep the humidity level in your home above 50%.  Breathe in steam for 10-15 minutes, 3-4 times a day or as told by your doctor. You can do this in the bathroom while a hot shower is running.  Try not to spend time in cool or dry air. Rest  Rest as much as possible.  Sleep with your head raised (elevated).  Make sure to get enough sleep each night. General instructions  Put a warm, moist washcloth on your face 3-4 times a day or as told by your doctor. This will help with discomfort.  Wash your hands often with soap and water. If there is no soap and water, use hand sanitizer.  Do not smoke. Avoid being around people who are smoking (secondhand smoke).  Keep all follow-up visits as told by your doctor. This is important. Contact a doctor if:  You have a  fever.  Your symptoms get worse.  Your symptoms do not get better within 10 days. Get help right away if:  You have a very bad headache.  You cannot stop throwing up (vomiting).  You have pain or swelling around your face or eyes.  You have trouble seeing.  You feel confused.  Your neck is stiff.  You have trouble breathing. This information is not intended to replace advice given to you by your health care provider. Make sure you discuss any questions you have with your health care provider. Document Released: 12/04/2007 Document Revised: 02/11/2016 Document Reviewed: 04/12/2015 Elsevier Interactive Patient Education  2017 [employer].

## 2016-06-13 NOTE — Progress Notes (Signed)
   HPI  Patient presents today here to discuss erectile dysfunction as well as cough and sinus pressure.  Erectile dysfunction Long-standing issue, patient is getting less response from 100 mg Viagra States he also has decreased sexual desire. He has a girlfriend and would like to maintain sexual activity.  He has tried 5 mg Cialis without improvement, he has tried 100 mg Viagra with moderate improvement, 50 mg Viagra is not helpful. Acute illness Patient complains of 2 days of cough, nasal congestion, sore throat. He also complains of body aches. He denies any chest pain. He has mild dyspnea. He has normal appetite and tolerating food and fluids normally.  Patient is smoking currently, he has cut back but is not interested in quitting completely currently.  PMH: Smoking status noted ROS: Per HPI  Objective: BP 131/86   Pulse 64   Temp 97 F (36.1 C) (Oral)   Ht 5\' 10"  (1.778 m)   Wt 242 lb 12.8 oz (110.1 kg)   BMI 34.84 kg/m  Gen: NAD, alert, cooperative with exam HEENT: NCAT, very mild pressure with palpation of the left maxillary sinus, TMs normal bilaterally, nares with swollen turbinates bilaterally, oropharynx moist with slight erythema CV: RRR, good S1/S2, no murmur Resp: CTABL, no wheezes, non-labored Ext: No edema, warm Neuro: Alert and oriented, No gross deficits  Assessment and plan:  # Erectile dysfunction Slowly worsening Discussed several possibilities including urology referral which she would like today. Given samples of 100 mg Viagra Discussed that hypertension meds as well as depression medications could be contributing, however I think that they offer more benefit than harm in this case.  # subacute maxillary sinusitis Discussed supportive care, likely his illness will improve in 2-3 days Given azithromycin in case symptoms worsen or continue. Azithromycin is not my usual first choice for sinusitis, however patient has had success with this in the  past with low side effect profile so I think it's reasonable.   # Tobacco abuse Recommended cessation, he is considering    Orders Placed This Encounter  Procedures  . Ambulatory referral to Urology    Referral Priority:   Routine    Referral Type:   Consultation    Referral Reason:   Specialty Services Required    Requested Specialty:   Urology    Number of Visits Requested:   1    Meds ordered this encounter  Medications  . azithromycin (ZITHROMAX) 250 MG tablet    Sig: Take 2 tablets on day 1 and 1 tablet daily after that    Dispense:  6 tablet    Refill:  0    Laroy Apple, MD [facility] 06/13/2016, 4:34 PM

## 2016-06-28 ENCOUNTER — Institutional Professional Consult (permissible substitution): Payer: Medicare Other | Admitting: Pulmonary Disease

## 2016-07-05 ENCOUNTER — Other Ambulatory Visit: Payer: Self-pay

## 2016-07-09 ENCOUNTER — Telehealth: Payer: Self-pay | Admitting: Family Medicine

## 2016-07-09 NOTE — Telephone Encounter (Signed)
Pt had previously taken Singular Would like a refill Not on currnet med list Please advise

## 2016-07-09 NOTE — Telephone Encounter (Signed)
I dont have enough documentation to support using it at this time. I would recommend discussing next follow up.   Laroy Apple, MD [facility] 07/09/2016, 5:35 PM

## 2016-07-10 NOTE — Telephone Encounter (Signed)
Left message,   no script of singulair to be filled.

## 2016-07-12 ENCOUNTER — Encounter: Payer: Self-pay | Admitting: Family Medicine

## 2016-07-12 ENCOUNTER — Ambulatory Visit (INDEPENDENT_AMBULATORY_CARE_PROVIDER_SITE_OTHER): Payer: Medicare Other | Admitting: Family Medicine

## 2016-07-12 ENCOUNTER — Ambulatory Visit (INDEPENDENT_AMBULATORY_CARE_PROVIDER_SITE_OTHER): Payer: Medicare Other

## 2016-07-12 VITALS — BP 121/81 | HR 84 | Temp 97.4°F | Ht 70.0 in | Wt 239.6 lb

## 2016-07-12 DIAGNOSIS — N529 Male erectile dysfunction, unspecified: Secondary | ICD-10-CM | POA: Diagnosis not present

## 2016-07-12 DIAGNOSIS — J45909 Unspecified asthma, uncomplicated: Secondary | ICD-10-CM

## 2016-07-12 MED ORDER — MONTELUKAST SODIUM 10 MG PO TABS: 10.0000 mg | ORAL_TABLET | Freq: Every day | ORAL | 3 refills | Status: DC

## 2016-07-12 MED ORDER — LEVALBUTEROL TARTRATE 45 MCG/ACT IN AERO: 2.0000 | INHALATION_SPRAY | Freq: Four times a day (QID) | RESPIRATORY_TRACT | 2 refills | Status: DC | PRN

## 2016-07-12 NOTE — Progress Notes (Signed)
HPI  Patient presents today here with dyspnea  He explains he has a Hx of asthma and requests singulair and albuterol. He states albuterol caused a severe panic attack previously. He states that he could start him on Xanax as well as albuterol to take care of that.  Patient states that he has worsening dyspnea with lying down. He has no exertional dyspnea. He states that after he gets home from working out he has several hours of trying to catch his breath.  PMH: Smoking status noted ROS: Per HPI  Objective: BP 121/81   Pulse 84   Temp 97.4 F (36.3 C) (Oral)   Ht 5\' 10"  (1.778 m)   Wt 239 lb 9.6 oz (108.7 kg)   BMI 34.38 kg/m  Gen: NAD, alert, cooperative with exam HEENT: NCAT CV: RRR, good S1/S2, no murmur Resp: CTABL, no wheezes, non-labored Ext: No edema, warm Neuro: Alert and oriented, No gross deficits  CXR- No infiltrate, normal expansion  Assessment and plan:  # Asthma New Dx, Historical diagnoses per patient's history Start on Singulair, given Xopenex for rescue inhaler. ( discussed no xanax for inhaler induced anxiety) Also ordered plain film which does not show any acute infiltrate. PFTs ordered. Consider controller inhaler if needed Low threshold for return if worsening Has Sleep study in about 1 month  Reports negative lexiscan previously  ED - Unchanged, stable Viagra sample given  HTN Well controlled on BB,  CCB No changes    Orders Placed This Encounter  Procedures  . DG Chest 2 View    Standing Status:   Future    Standing Expiration Date:   09/09/2017    Order Specific Question:   Reason for Exam (SYMPTOM  OR DIAGNOSIS REQUIRED)    Answer:   dyspnea    Order Specific Question:   Preferred imaging location?    Answer:   Internal  . Pulmonary function test    Standing Status:   Future    Standing Expiration Date:   07/12/2017    Order Specific Question:   Where should this test be performed?    Answer:   [facility]    Order  Specific Question:   Full PFT: includes the following: basic spirometry, spirometry pre & post bronchodilator, diffusion capacity (DLCO), lung volumes    Answer:   Full PFT    Order Specific Question:   MIP/MEP    Answer:   Yes    Order Specific Question:   6 minute walk    Answer:   Yes    Order Specific Question:   ABG    Answer:   No    Order Specific Question:   Diffusion capacity (DLCO)    Answer:   Yes    Order Specific Question:   Lung volumes    Answer:   Yes    Order Specific Question:   Methacholine challenge    Answer:   No    Meds ordered this encounter  Medications  . levalbuterol (XOPENEX HFA) 45 MCG/ACT inhaler    Sig: Inhale 2 puffs into the lungs every 6 (six) hours as needed for wheezing or shortness of breath.    Dispense:  1 Inhaler    Refill:  2  . montelukast (SINGULAIR) 10 MG tablet    Sig: Take 1 tablet (10 mg total) by mouth at bedtime.    Dispense:  30 tablet    Refill:  Carey, MD [facility] 07/12/2016, 4:01 PM

## 2016-07-12 NOTE — Patient Instructions (Signed)
Great to see you!  We will call with chest x ray results within a few days. Start singulair daily, use xopenex as needed  I have also ordered pulmonary function tests, you will be called to schedule these.

## 2016-07-16 ENCOUNTER — Other Ambulatory Visit: Payer: Self-pay | Admitting: Family Medicine

## 2016-07-31 ENCOUNTER — Ambulatory Visit (INDEPENDENT_AMBULATORY_CARE_PROVIDER_SITE_OTHER): Payer: Medicare Other | Admitting: Urology

## 2016-07-31 DIAGNOSIS — N5201 Erectile dysfunction due to arterial insufficiency: Secondary | ICD-10-CM

## 2016-08-07 ENCOUNTER — Encounter: Payer: Self-pay | Admitting: Pulmonary Disease

## 2016-08-07 ENCOUNTER — Ambulatory Visit (INDEPENDENT_AMBULATORY_CARE_PROVIDER_SITE_OTHER): Payer: Medicare Other | Admitting: Pulmonary Disease

## 2016-08-07 VITALS — BP 124/78 | HR 72 | Ht 70.0 in | Wt 246.0 lb

## 2016-08-07 DIAGNOSIS — E669 Obesity, unspecified: Secondary | ICD-10-CM | POA: Diagnosis not present

## 2016-08-07 DIAGNOSIS — Z6835 Body mass index (BMI) 35.0-35.9, adult: Secondary | ICD-10-CM | POA: Diagnosis not present

## 2016-08-07 DIAGNOSIS — G4733 Obstructive sleep apnea (adult) (pediatric): Secondary | ICD-10-CM | POA: Diagnosis not present

## 2016-08-07 NOTE — Assessment & Plan Note (Signed)
Weight reduction 

## 2016-08-07 NOTE — Assessment & Plan Note (Signed)
Patient had a sleep study in 2012. Unsure severity. He was placed on CPAP which she uses for 3 months. He could not tolerate it. Pressure was too much, uncomfortable with the mask, having a lot of leaks.   Patient has snoring, witnessed apneas, has gasping and choking, has frequent awakenings.  Sleeps 8 hrs/night. Wakes up unrefreshed in am.  Very tired and fatigued. Naps in am. (-) abnormal behavior in sleep. He has gained 30 pounds last year. His symptoms have gotten worse. Very  sleepy during the day.  Has depression and anxiety. Controlled.   ESS 17.    Plan :  We discussed about the diagnosis of Obstructive Sleep Apnea (OSA) and implications of untreated OSA. We discussed about CPAP and BiPaP as possible treatment options.    We will schedule the patient for a sleep study. Plan for a home sleep study. He sleeps on his shoulder, prone position. Cannot sleep on his back because of his back issues related to falls would work. I imagine, he'll not be able to fall sleep during a lab study. He did not tolerate CPAP years ago. Pressure was too much. We can try 5-10 centimeters water. Will need a mask fitting session. Very symptomatic.   Patient was instructed to call the office if he/she has not heard back from the office 1-2 weeks after the sleep study.   Patient was instructed to call the office if he/she is having issues with the PAP device.   We discussed good sleep hygiene.   Patient was advised not to engage in activities requiring concentration and/or vigilance if he/she is sleepy.  Patient was advised not to drive if he/she is sleepy.

## 2016-08-07 NOTE — Progress Notes (Signed)
Subjective:    Patient ID: David Simon, male    DOB: 02-07-1956, 61 y.o.   MRN: [id number]  HPI   This is the case of David Simon, 61 y.o. Male, who was referred by Dr. Kenn File in consultation regarding  OSA.   As you very well know, patient is a non smoker, has asthma/allergies controlled with Singulair.  Proventil made him "crazy".   Patient had a sleep study in 2012. Unsure severity. He was placed on CPAP which she uses for 3 months. He could not tolerate it. Pressure was too much, uncomfortable with the mask, having a lot of leaks.   Patient has snoring, witnessed apneas, has gasping and choking, has frequent awakenings.  Sleeps 8 hrs/night. Wakes up unrefreshed in am.  Very tired and fatigued. Naps in am. (-) abnormal behavior in sleep. He has gained 30 pounds last year. His symptoms have gotten worse. Very  sleepy during the day.  Has depression and anxiety. Controlled.   ESS 17.   Review of Systems  Constitutional: Negative.  Negative for fever and unexpected weight change.  HENT: Positive for sinus pressure. Negative for congestion, dental problem, ear pain, nosebleeds, postnasal drip, rhinorrhea, sneezing, sore throat and trouble swallowing.   Eyes: Positive for itching. Negative for redness.  Respiratory: Positive for cough, shortness of breath and wheezing. Negative for chest tightness.   Cardiovascular: Positive for leg swelling. Negative for palpitations.  Gastrointestinal: Positive for nausea. Negative for vomiting.  Endocrine: Negative.   Genitourinary: Negative.  Negative for dysuria.  Musculoskeletal: Positive for joint swelling.  Skin: Negative.  Negative for rash.  Allergic/Immunologic: Positive for environmental allergies.  Neurological: Negative.  Negative for headaches.  Hematological: Negative.  Does not bruise/bleed easily.  Psychiatric/Behavioral: Negative.  Negative for dysphoric mood. The patient is not nervous/anxious.    Past Medical History:    Diagnosis Date  . Allergy   . Anxiety   . Arthritis   . Asthma   . Depression   . Essential hypertension   . Fracture of neck ([place])    Traumatic event 2008  . History of diabetes mellitus   . Hypercholesterolemia   . Seasonal allergies   . Spinal stenosis    (-) CA, DVT.   Family History  Problem Relation Age of Onset  . Hypertension Mother   . High Cholesterol Mother   . Heart disease Father      Past Surgical History:  Procedure Laterality Date  . CARPAL TUNNEL RELEASE    . HIP SURGERY    . NASAL SINUS SURGERY      Social History   Social History  . Marital status: Legally Separated    Spouse name: N/A  . Number of children: N/A  . Years of education: N/A   Occupational History  . Not on file.   Social History Main Topics  . Smoking status: Current Some Day Smoker    Packs/day: 0.00    Types: Cigars  . Smokeless tobacco: Never Used  . Alcohol use 0.0 oz/week     Comment: very seldom  . Drug use: No  . Sexual activity: Yes   Other Topics Concern  . Not on file   Social History Narrative  . No narrative on file    Single, has a daughter, lives in [place]. 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  On disability 2/2 Falls. Grew up in [place].   Allergies  Allergen Reactions  . Other     Pill for asthma  Outpatient Medications Prior to Visit  Medication Sig Dispense Refill  . amLODipine (NORVASC) 10 MG tablet Take 1 tablet (10 mg total) by mouth daily. 90 tablet 0  . atorvastatin (LIPITOR) 10 MG tablet TAKE ONE TABLET BY MOUTH ONCE DAILY 90 tablet 2  . DULoxetine (CYMBALTA) 60 MG capsule Take 1 capsule (60 mg total) by mouth at bedtime. 30 capsule 2  . fluticasone (FLONASE) 50 MCG/ACT nasal spray Place into both nostrils daily.    Marland Kitchen levocetirizine (XYZAL) 5 MG tablet Take 1 tablet (5 mg total) by mouth every evening. 90 tablet 1  . lisinopril-hydrochlorothiazide (PRINZIDE,ZESTORETIC) 20-25 MG tablet Take 1 tablet by mouth daily. 90 tablet 0  . meloxicam (MOBIC)  15 MG tablet TAKE ONE TABLET BY MOUTH ONCE DAILY 30 tablet 0  . metoprolol (LOPRESSOR) 100 MG tablet Take 0.5 tablets (50 mg total) by mouth 2 (two) times daily. 60 tablet 4  . montelukast (SINGULAIR) 10 MG tablet Take 1 tablet (10 mg total) by mouth at bedtime. 30 tablet 3  . pantoprazole (PROTONIX) 40 MG tablet Take 1 tablet (40 mg total) by mouth daily. 30 tablet 3  . risperiDONE (RISPERDAL) 0.5 MG tablet Take 1 tablet (0.5 mg total) by mouth at bedtime. 90 tablet 1  . sildenafil (VIAGRA) 100 MG tablet Take 0.5-1 tablets (50-100 mg total) by mouth daily as needed for erectile dysfunction. 6 tablet 0  . trazodone (DESYREL) 300 MG tablet TAKE ONE TABLET BY MOUTH ONCE DAILY AT BEDTIME AS NEEDED FOR SLEEP 30 tablet 2  . meloxicam (MOBIC) 15 MG tablet Take 1 tablet by mouth  daily 90 tablet 0  . levalbuterol (XOPENEX HFA) 45 MCG/ACT inhaler Inhale 2 puffs into the lungs every 6 (six) hours as needed for wheezing or shortness of breath. (Patient not taking: Reported on 08/07/2016) 1 Inhaler 2   No facility-administered medications prior to visit.    No orders of the defined types were placed in this encounter.       Objective:   Physical Exam  Vitals:  Vitals:   08/07/16 1412  BP: 124/78  Pulse: 72  SpO2: 97%  Weight: 246 lb (111.6 kg)  Height: 5\' 10"  (1.778 m)    Constitutional/General:  Pleasant, well-nourished, well-developed, not in any distress,  Comfortably seating.  Well kempt  Body mass index is 35.3 kg/m. Wt Readings from Last 3 Encounters:  08/07/16 246 lb (111.6 kg)  07/12/16 239 lb 9.6 oz (108.7 kg)  06/13/16 242 lb 12.8 oz (110.1 kg)    Neck circumference:   HEENT: Pupils equal and reactive to light and accommodation. Anicteric sclerae. Normal nasal mucosa.   No oral  lesions,  mouth clear,  oropharynx clear, no postnasal drip. (-) Oral thrush. No dental caries.  Airway - Mallampati class III  Neck: No masses. Midline trachea. No JVD, (-) LAD. (-) bruits  appreciated.  Respiratory/Chest: Grossly normal chest. (-) deformity. (-) Accessory muscle use.  Symmetric expansion. (-) Tenderness on palpation.  Resonant on percussion.  Diminished BS on both lower lung zones. (-) wheezing, crackles, rhonchi (-) egophony  Cardiovascular: Regular rate and  rhythm, heart sounds normal, no murmur or gallops, no peripheral edema  Gastrointestinal:  Normal bowel sounds. Soft, non-tender. No hepatosplenomegaly.  (-) masses.   Musculoskeletal:  Normal muscle tone. Normal gait.   Extremities: Grossly normal. (-) clubbing, cyanosis.  (-) edema  Skin: (-) rash,lesions seen.   Neurological/Psychiatric : alert, oriented to time, place, person. Normal mood and affect  Assessment & Plan:  OSA (obstructive sleep apnea) Patient had a sleep study in 2012. Unsure severity. He was placed on CPAP which she uses for 3 months. He could not tolerate it. Pressure was too much, uncomfortable with the mask, having a lot of leaks.   Patient has snoring, witnessed apneas, has gasping and choking, has frequent awakenings.  Sleeps 8 hrs/night. Wakes up unrefreshed in am.  Very tired and fatigued. Naps in am. (-) abnormal behavior in sleep. He has gained 30 pounds last year. His symptoms have gotten worse. Very  sleepy during the day.  Has depression and anxiety. Controlled.   ESS 17.    Plan :  We discussed about the diagnosis of Obstructive Sleep Apnea (OSA) and implications of untreated OSA. We discussed about CPAP and BiPaP as possible treatment options.    We will schedule the patient for a sleep study. Plan for a home sleep study. He sleeps on his shoulder, prone position. Cannot sleep on his back because of his back issues related to falls would work. I imagine, he'll not be able to fall sleep during a lab study. He did not tolerate CPAP years ago. Pressure was too much. We can try 5-10 centimeters water. Will need a mask fitting session. Very  symptomatic.   Patient was instructed to call the office if he/she has not heard back from the office 1-2 weeks after the sleep study.   Patient was instructed to call the office if he/she is having issues with the PAP device.   We discussed good sleep hygiene.   Patient was advised not to engage in activities requiring concentration and/or vigilance if he/she is sleepy.  Patient was advised not to drive if he/she is sleepy.    Obesity Weight reduction    Thank you very much for letting me participate in this patient's care. Please do not hesitate to give me a call if you have any questions or concerns regarding the treatment plan.   Patient will follow up with me in 8-10 weeks.     Monica Becton, MD 08/07/2016   3:00 PM [facility] Pager: [phone] Office: [phone], Fax: [phone]

## 2016-08-07 NOTE — Patient Instructions (Signed)

## 2016-08-10 ENCOUNTER — Other Ambulatory Visit: Payer: Self-pay | Admitting: Family Medicine

## 2016-08-14 ENCOUNTER — Ambulatory Visit (INDEPENDENT_AMBULATORY_CARE_PROVIDER_SITE_OTHER): Payer: Medicare Other | Admitting: Urology

## 2016-08-14 DIAGNOSIS — N5201 Erectile dysfunction due to arterial insufficiency: Secondary | ICD-10-CM | POA: Diagnosis not present

## 2016-08-18 ENCOUNTER — Other Ambulatory Visit: Payer: Self-pay | Admitting: Family Medicine

## 2016-08-18 DIAGNOSIS — I10 Essential (primary) hypertension: Secondary | ICD-10-CM

## 2016-08-22 ENCOUNTER — Telehealth: Payer: Self-pay | Admitting: Pulmonary Disease

## 2016-08-22 NOTE — Telephone Encounter (Signed)
Pt calling back again [phone]

## 2016-08-23 NOTE — Telephone Encounter (Signed)
Spoke to pt he is aware anita has his order and will be calling him next week when she gets back on Monday Joellen Jersey

## 2016-08-26 ENCOUNTER — Other Ambulatory Visit: Payer: Self-pay | Admitting: Family Medicine

## 2016-08-27 ENCOUNTER — Telehealth: Payer: Self-pay | Admitting: Pulmonary Disease

## 2016-08-27 NOTE — Telephone Encounter (Signed)
Will route to [facility]. thanks

## 2016-08-27 NOTE — Telephone Encounter (Signed)
I spoke with Mr. Pettaway and he will be picking up the HST on 09/05/2016 @ 4:00pm

## 2016-09-02 ENCOUNTER — Other Ambulatory Visit: Payer: Self-pay | Admitting: Family Medicine

## 2016-09-05 DIAGNOSIS — G4733 Obstructive sleep apnea (adult) (pediatric): Secondary | ICD-10-CM | POA: Diagnosis not present

## 2016-09-10 ENCOUNTER — Other Ambulatory Visit: Payer: Self-pay | Admitting: Family Medicine

## 2016-09-10 ENCOUNTER — Telehealth: Payer: Self-pay | Admitting: Pulmonary Disease

## 2016-09-10 DIAGNOSIS — G4733 Obstructive sleep apnea (adult) (pediatric): Secondary | ICD-10-CM | POA: Diagnosis not present

## 2016-09-10 NOTE — Telephone Encounter (Signed)
    Please call the pt and tell the pt the [place]  showed OSA  Pt stops breathing 10   times an hour.   Home sleep study was done on : 09/05/2016  Please order autoCPAP 5-10 cm H2O. Patient will need a mask fitting session. Patient will need a 1 month download. Let me know if he wants to be seen before we start cpap treatment.   Patient needs to be seen by me or any of the NPs/APPs  4-6 weeks after obtaining the cpap machine. Let me know if you receive this.   Thanks!   J. Shirl Harris, MD 09/10/2016, 1:07 PM

## 2016-09-10 NOTE — Telephone Encounter (Signed)
Patient is returning phone call.  °

## 2016-09-10 NOTE — Telephone Encounter (Signed)
lmomtcb x1 

## 2016-09-11 NOTE — Telephone Encounter (Signed)
lmomtcb x 2  

## 2016-09-11 NOTE — Telephone Encounter (Signed)
Spoke with pt, aware of recs.  Wishes to proceed with cpap.  cpap ordered.  Pt will call when he receives cpap to schedule rov in appropriate time frame.  Nothing further needed at this time.

## 2016-09-12 ENCOUNTER — Other Ambulatory Visit: Payer: Self-pay | Admitting: *Deleted

## 2016-09-12 DIAGNOSIS — G4733 Obstructive sleep apnea (adult) (pediatric): Secondary | ICD-10-CM

## 2016-09-17 DIAGNOSIS — G4733 Obstructive sleep apnea (adult) (pediatric): Secondary | ICD-10-CM | POA: Diagnosis not present

## 2016-09-19 ENCOUNTER — Other Ambulatory Visit: Payer: Self-pay | Admitting: Family Medicine

## 2016-09-30 ENCOUNTER — Encounter (INDEPENDENT_AMBULATORY_CARE_PROVIDER_SITE_OTHER): Payer: Self-pay | Admitting: *Deleted

## 2016-09-30 ENCOUNTER — Telehealth: Payer: Self-pay | Admitting: Family Medicine

## 2016-09-30 DIAGNOSIS — Z1211 Encounter for screening for malignant neoplasm of colon: Secondary | ICD-10-CM

## 2016-09-30 NOTE — Telephone Encounter (Signed)
Pt requesting colonoscopy Referral entered in [facility] per Dr Wendi Snipes

## 2016-10-02 ENCOUNTER — Telehealth: Payer: Self-pay | Admitting: Family Medicine

## 2016-10-03 NOTE — Telephone Encounter (Signed)
This can be administered by his dentist. Since he will receive a procedure from them I would recommend requesting from them or alternative.   Laroy Apple, MD [facility] 10/03/2016, 7:35 AM

## 2016-10-03 NOTE — Telephone Encounter (Signed)
Pt is aware.  

## 2016-10-16 ENCOUNTER — Other Ambulatory Visit: Payer: Self-pay | Admitting: Family Medicine

## 2016-10-18 ENCOUNTER — Ambulatory Visit: Payer: Medicare Other | Admitting: Pulmonary Disease

## 2016-10-21 ENCOUNTER — Ambulatory Visit (INDEPENDENT_AMBULATORY_CARE_PROVIDER_SITE_OTHER): Payer: Medicare Other | Admitting: Internal Medicine

## 2016-10-21 DIAGNOSIS — J45909 Unspecified asthma, uncomplicated: Secondary | ICD-10-CM

## 2016-10-21 LAB — PULMONARY FUNCTION TEST
DL/VA % PRED: 82 %
DL/VA: 3.76 ml/min/mmHg/L
DLCO UNC % PRED: 74 %
DLCO cor % pred: 76 %
DLCO cor: 24.05 ml/min/mmHg
DLCO unc: 23.34 ml/min/mmHg
FEF 25-75 PRE: 3.6 L/s
FEF 25-75 Post: 4.17 L/sec
FEF2575-%CHANGE-POST: 15 %
FEF2575-%PRED-POST: 146 %
FEF2575-%Pred-Pre: 126 %
FEV1-%CHANGE-POST: 2 %
FEV1-%PRED-PRE: 102 %
FEV1-%Pred-Post: 105 %
FEV1-PRE: 3.57 L
FEV1-Post: 3.67 L
FEV1FVC-%CHANGE-POST: 4 %
FEV1FVC-%Pred-Pre: 107 %
FEV6-%CHANGE-POST: -2 %
FEV6-%PRED-POST: 98 %
FEV6-%PRED-PRE: 100 %
FEV6-PRE: 4.42 L
FEV6-Post: 4.32 L
FEV6FVC-%CHANGE-POST: 0 %
FEV6FVC-%PRED-PRE: 105 %
FEV6FVC-%Pred-Post: 104 %
FVC-%CHANGE-POST: -1 %
FVC-%Pred-Post: 94 %
FVC-%Pred-Pre: 96 %
FVC-Post: 4.34 L
FVC-Pre: 4.42 L
POST FEV6/FVC RATIO: 100 %
PRE FEV6/FVC RATIO: 100 %
Post FEV1/FVC ratio: 85 %
Pre FEV1/FVC ratio: 81 %
RV % pred: 84 %
RV: 1.88 L
TLC % pred: 89 %
TLC: 6.13 L

## 2016-10-21 NOTE — Progress Notes (Signed)
PFT done today. 

## 2016-10-29 ENCOUNTER — Ambulatory Visit: Payer: Medicare Other | Admitting: Adult Health

## 2016-10-30 ENCOUNTER — Ambulatory Visit: Payer: Medicare Other | Admitting: Family Medicine

## 2016-11-04 ENCOUNTER — Telehealth: Payer: Self-pay | Admitting: Family Medicine

## 2016-11-04 ENCOUNTER — Ambulatory Visit: Payer: Medicare Other

## 2016-11-04 NOTE — Telephone Encounter (Signed)
Patient removed a tick but has no symptoms of any infections at this time. FYI, (He has an antibiotic from the dentist that he is starting today.)  Advised patient unless he develops flu like symptoms or has severe redness and swelling at site of bite , he probably will be fine.  Schedule an appointment if problems occur and he would probably have lab work to test for diseases.

## 2016-11-05 ENCOUNTER — Encounter: Payer: Self-pay | Admitting: Family Medicine

## 2016-11-10 ENCOUNTER — Other Ambulatory Visit: Payer: Self-pay | Admitting: Family Medicine

## 2016-11-13 ENCOUNTER — Telehealth: Payer: Self-pay

## 2016-11-21 ENCOUNTER — Other Ambulatory Visit: Payer: Self-pay | Admitting: Family Medicine

## 2016-11-21 DIAGNOSIS — I10 Essential (primary) hypertension: Secondary | ICD-10-CM

## 2016-11-26 ENCOUNTER — Other Ambulatory Visit: Payer: Self-pay | Admitting: Family Medicine

## 2016-12-01 ENCOUNTER — Other Ambulatory Visit: Payer: Self-pay | Admitting: Family Medicine

## 2016-12-09 ENCOUNTER — Other Ambulatory Visit: Payer: Self-pay

## 2016-12-09 ENCOUNTER — Telehealth: Payer: Self-pay | Admitting: Family Medicine

## 2016-12-09 MED ORDER — ATORVASTATIN CALCIUM 10 MG PO TABS: 10.0000 mg | ORAL_TABLET | Freq: Every day | ORAL | 0 refills | Status: DC

## 2016-12-09 MED ORDER — DULOXETINE HCL 60 MG PO CPEP: ORAL_CAPSULE | ORAL | 0 refills | Status: DC

## 2016-12-09 MED ORDER — MONTELUKAST SODIUM 10 MG PO TABS: 10.0000 mg | ORAL_TABLET | Freq: Every day | ORAL | 0 refills | Status: DC

## 2016-12-09 NOTE — Telephone Encounter (Signed)
Ok with refills as listed.   Laroy Apple, MD [facility] 12/09/2016, 5:02 PM

## 2016-12-09 NOTE — Telephone Encounter (Signed)
Note sent to Dr Wendi Snipes

## 2016-12-10 ENCOUNTER — Telehealth: Payer: Self-pay | Admitting: Family Medicine

## 2016-12-11 ENCOUNTER — Other Ambulatory Visit: Payer: Self-pay | Admitting: *Deleted

## 2016-12-11 MED ORDER — MONTELUKAST SODIUM 10 MG PO TABS: 10.0000 mg | ORAL_TABLET | Freq: Every day | ORAL | 0 refills | Status: DC

## 2016-12-11 MED ORDER — ATORVASTATIN CALCIUM 10 MG PO TABS: 10.0000 mg | ORAL_TABLET | Freq: Every day | ORAL | 0 refills | Status: DC

## 2016-12-11 MED ORDER — DULOXETINE HCL 60 MG PO CPEP: ORAL_CAPSULE | ORAL | 0 refills | Status: DC

## 2016-12-11 NOTE — Progress Notes (Signed)
RXs sent to pharmacy per pt request Okayed per Dr Wendi Snipes

## 2016-12-11 NOTE — Telephone Encounter (Signed)
Please address

## 2016-12-23 ENCOUNTER — Other Ambulatory Visit: Payer: Self-pay | Admitting: Family Medicine

## 2017-01-04 ENCOUNTER — Other Ambulatory Visit: Payer: Self-pay | Admitting: Family Medicine

## 2017-01-12 ENCOUNTER — Other Ambulatory Visit: Payer: Self-pay | Admitting: Family Medicine

## 2017-01-14 NOTE — Telephone Encounter (Signed)
Last seen 1/18  Dr Wendi Snipes

## 2017-01-17 ENCOUNTER — Other Ambulatory Visit: Payer: Self-pay | Admitting: Family Medicine

## 2017-01-17 ENCOUNTER — Other Ambulatory Visit: Payer: Self-pay

## 2017-01-17 MED ORDER — DULOXETINE HCL 60 MG PO CPEP: ORAL_CAPSULE | ORAL | 0 refills | Status: DC

## 2017-01-20 NOTE — Telephone Encounter (Signed)
Last seen 05/02/17  Dr Bradshaw 

## 2017-01-23 ENCOUNTER — Encounter (INDEPENDENT_AMBULATORY_CARE_PROVIDER_SITE_OTHER): Payer: Self-pay

## 2017-01-23 ENCOUNTER — Encounter: Payer: Self-pay | Admitting: Family Medicine

## 2017-01-23 ENCOUNTER — Ambulatory Visit (INDEPENDENT_AMBULATORY_CARE_PROVIDER_SITE_OTHER): Payer: Medicare Other | Admitting: Family Medicine

## 2017-01-23 VITALS — BP 129/82 | HR 61 | Temp 97.3°F | Ht 70.0 in | Wt 246.0 lb

## 2017-01-23 DIAGNOSIS — R1084 Generalized abdominal pain: Secondary | ICD-10-CM | POA: Diagnosis not present

## 2017-01-23 DIAGNOSIS — M48061 Spinal stenosis, lumbar region without neurogenic claudication: Secondary | ICD-10-CM | POA: Diagnosis not present

## 2017-01-23 DIAGNOSIS — Z6835 Body mass index (BMI) 35.0-35.9, adult: Secondary | ICD-10-CM

## 2017-01-23 DIAGNOSIS — E785 Hyperlipidemia, unspecified: Secondary | ICD-10-CM

## 2017-01-23 DIAGNOSIS — E669 Obesity, unspecified: Secondary | ICD-10-CM | POA: Diagnosis not present

## 2017-01-23 MED ORDER — PREGABALIN 75 MG PO CAPS: 75.0000 mg | ORAL_CAPSULE | Freq: Two times a day (BID) | ORAL | 3 refills | Status: DC

## 2017-01-23 MED ORDER — GABAPENTIN 400 MG PO CAPS: 400.0000 mg | ORAL_CAPSULE | Freq: Three times a day (TID) | ORAL | 3 refills | Status: DC

## 2017-01-23 MED ORDER — MELOXICAM 15 MG PO TABS: 15.0000 mg | ORAL_TABLET | Freq: Every day | ORAL | 2 refills | Status: DC

## 2017-01-23 NOTE — Patient Instructions (Addendum)
Great to see you!   

## 2017-01-23 NOTE — Progress Notes (Signed)
   HPI  Patient presents today here to discuss chronic medical problems  Abdominal pain Has been going on for several months, states that it's worse in the morning and gets better with eating, it does come back between meals. His significant other, girlfriend, is present and states that he's been taking much more ibuprofen than previously stated. I have explained that meloxicam and ibuprofen or both NSAIDs and should not be combined.  Hyperlipidemia, obesity Watching diet somewhat, still taking Crestor with good tolerance. Nonfasting today, has had coffee with cream and sugar.  Arthritis Patient with chronic low back pain, now with some sciatica symptoms down the right leg. Patient has been taking Cymbalta, meloxicam. Pain is still uncontrolled, as above he has also been taking ibuprofen as well.  PMH: Smoking status noted ROS: Per HPI  Objective: BP 129/82   Pulse 61   Temp (!) 97.3 F (36.3 C) (Oral)   Ht '5\' 10"'$  (1.778 m)   Wt 246 lb (111.6 kg)   BMI 35.30 kg/m  Gen: NAD, alert, cooperative with exam HEENT: NCAT CV: RRR, good S1/S2, no murmur Resp: CTABL, no wheezes, non-labored Abd: SNTND, BS present, no guarding or organomegaly Ext: No edema, warm Neuro: Alert and oriented, No gross deficits  Assessment and plan:  # Abdominal pain With high NSAID use I am concerned about peptic ulcer Discussed decreasing NSAIDs, however he does not believe he can tolerate lower doses at this time. Continue PPI Discontinue all other NSAIDs except for meloxicam, I have discussed multiple times and the risks of using chronic NSAIDs, at this time I have continued this cautiously because I believe if he were to dose his own NSAIDs he would likely overdose significantly. Refer to GI Abdominal exam is reassuring  # Hyperlipidemia, obesity Obesity stable, tolerating Crestor well Lipid panel today  # Chronic back pain, spinal stenosis Now with some neurologic symptoms of  radiculopathy Trial of gabapentin Patient will call his orthopedist to discuss additional injections, he did have good improvement previously.    Orders Placed This Encounter  Procedures  . CMP14+EGFR  . Lipid panel  . CBC with Differential/Platelet  . TSH  . Bayer DCA Hb A1c Waived  . Ambulatory referral to Gastroenterology    Referral Priority:   Routine    Referral Type:   Consultation    Referral Reason:   Specialty Services Required    Number of Visits Requested:   1    Meds ordered this encounter  Medications  . DISCONTD: pregabalin (LYRICA) 75 MG capsule    Sig: Take 1 capsule (75 mg total) by mouth 2 (two) times daily.    Dispense:  60 capsule    Refill:  3  . meloxicam (MOBIC) 15 MG tablet    Sig: Take 1 tablet (15 mg total) by mouth daily.    Dispense:  30 tablet    Refill:  2    Please consider 90 day supplies to promote better adherence  . gabapentin (NEURONTIN) 400 MG capsule    Sig: Take 1 capsule (400 mg total) by mouth 3 (three) times daily.    Dispense:  90 capsule    Refill:  [place], MD [facility] 01/23/2017, 4:54 PM

## 2017-01-24 ENCOUNTER — Other Ambulatory Visit: Payer: Self-pay | Admitting: Family Medicine

## 2017-01-24 ENCOUNTER — Telehealth: Payer: Self-pay | Admitting: Family Medicine

## 2017-01-24 LAB — CMP14+EGFR
A/G RATIO: 2 (ref 1.2–2.2)
ALK PHOS: 50 IU/L (ref 39–117)
ALT: 10 IU/L (ref 0–44)
AST: 25 IU/L (ref 0–40)
Albumin: 4.5 g/dL (ref 3.6–4.8)
BILIRUBIN TOTAL: 0.4 mg/dL (ref 0.0–1.2)
BUN/Creatinine Ratio: 15 (ref 10–24)
BUN: 17 mg/dL (ref 8–27)
CHLORIDE: 100 mmol/L (ref 96–106)
CO2: 22 mmol/L (ref 20–29)
Calcium: 9.6 mg/dL (ref 8.6–10.2)
Creatinine, Ser: 1.17 mg/dL (ref 0.76–1.27)
GFR calc Af Amer: 77 mL/min/{1.73_m2} (ref >59)
GFR, EST NON AFRICAN AMERICAN: 67 mL/min/{1.73_m2} (ref >59)
GLOBULIN, TOTAL: 2.2 g/dL (ref 1.5–4.5)
Glucose: 85 mg/dL (ref 65–99)
POTASSIUM: 4.2 mmol/L (ref 3.5–5.2)
SODIUM: 139 mmol/L (ref 134–144)
Total Protein: 6.7 g/dL (ref 6.0–8.5)

## 2017-01-24 LAB — CBC WITH DIFFERENTIAL/PLATELET
BASOS ABS: 0.1 10*3/uL (ref 0.0–0.2)
Basos: 1 %
EOS (ABSOLUTE): 0.2 10*3/uL (ref 0.0–0.4)
Eos: 3 %
HEMATOCRIT: 37.2 % — AB (ref 37.5–51.0)
HEMOGLOBIN: 13.1 g/dL (ref 13.0–17.7)
Immature Grans (Abs): 0 10*3/uL (ref 0.0–0.1)
Immature Granulocytes: 0 %
LYMPHS ABS: 2.4 10*3/uL (ref 0.7–3.1)
Lymphs: 30 %
MCH: 29.2 pg (ref 26.6–33.0)
MCHC: 35.2 g/dL (ref 31.5–35.7)
MCV: 83 fL (ref 79–97)
MONOCYTES: 9 %
MONOS ABS: 0.7 10*3/uL (ref 0.1–0.9)
NEUTROS ABS: 4.5 10*3/uL (ref 1.4–7.0)
Neutrophils: 57 %
Platelets: 286 10*3/uL (ref 150–379)
RBC: 4.48 x10E6/uL (ref 4.14–5.80)
RDW: 14.1 % (ref 12.3–15.4)
WBC: 7.8 10*3/uL (ref 3.4–10.8)

## 2017-01-24 LAB — BAYER DCA HB A1C WAIVED: HB A1C: 5.4 % (ref <7.0)

## 2017-01-24 LAB — LIPID PANEL
CHOL/HDL RATIO: 8.1 ratio — AB (ref 0.0–5.0)
Cholesterol, Total: 285 mg/dL — ABNORMAL HIGH (ref 100–199)
HDL: 35 mg/dL — ABNORMAL LOW (ref >39)
TRIGLYCERIDES: 599 mg/dL — AB (ref 0–149)

## 2017-01-24 LAB — TSH: TSH: 2.12 u[IU]/mL (ref 0.450–4.500)

## 2017-01-24 NOTE — Telephone Encounter (Signed)
ERROR

## 2017-01-27 ENCOUNTER — Telehealth: Payer: Self-pay | Admitting: Family Medicine

## 2017-01-27 NOTE — Telephone Encounter (Signed)
Pt notified of results Verbalizes understanding Will come in for repeat Lipid

## 2017-01-28 ENCOUNTER — Other Ambulatory Visit: Payer: Self-pay

## 2017-01-28 DIAGNOSIS — E782 Mixed hyperlipidemia: Secondary | ICD-10-CM

## 2017-01-28 NOTE — Telephone Encounter (Signed)
David Simon has these request and will address today

## 2017-02-14 IMAGING — NM NM MYOCAR MULTI W/SPECT W/WALL MOTION & EF
2 series · 12 of 12 positions shown · non-contrast
Comparison: none

[Series 1: rest · 8.28mm/px · 6 of 64 frames shown]
[frame 6/64]
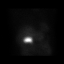
[frame 16/64]
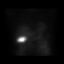
[frame 27/64]
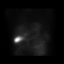
[frame 38/64]
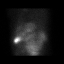
[frame 48/64]
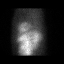
[frame 59/64]
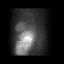

[Series 2: stress gated · 8.28mm/px · 6 of 64 frames shown]
[frame 6/64]
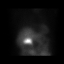
[frame 16/64]
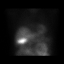
[frame 27/64]
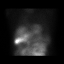
[frame 38/64]
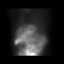
[frame 48/64]
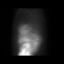
[frame 59/64]
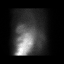

[12 of 12 positions shown; findings below may reference images not displayed]

Canned report from images found in remote index.

Refer to host system for actual result text.

## 2017-02-17 ENCOUNTER — Other Ambulatory Visit: Payer: Self-pay | Admitting: Family Medicine

## 2017-02-17 DIAGNOSIS — I10 Essential (primary) hypertension: Secondary | ICD-10-CM

## 2017-02-18 ENCOUNTER — Other Ambulatory Visit: Payer: Medicare Other

## 2017-02-18 DIAGNOSIS — E782 Mixed hyperlipidemia: Secondary | ICD-10-CM

## 2017-02-19 ENCOUNTER — Other Ambulatory Visit: Payer: Self-pay | Admitting: Family Medicine

## 2017-02-19 LAB — LIPID PANEL
Chol/HDL Ratio: 8.4 ratio — ABNORMAL HIGH (ref 0.0–5.0)
Cholesterol, Total: 259 mg/dL — ABNORMAL HIGH (ref 100–199)
HDL: 31 mg/dL — AB (ref >39)
TRIGLYCERIDES: 649 mg/dL — AB (ref 0–149)

## 2017-02-20 ENCOUNTER — Other Ambulatory Visit: Payer: Self-pay | Admitting: *Deleted

## 2017-02-20 ENCOUNTER — Other Ambulatory Visit: Payer: Self-pay | Admitting: Family Medicine

## 2017-02-20 MED ORDER — FENOFIBRATE 54 MG PO TABS: 54.0000 mg | ORAL_TABLET | Freq: Every day | ORAL | 1 refills | Status: DC

## 2017-03-05 ENCOUNTER — Encounter: Payer: Self-pay | Admitting: Family Medicine

## 2017-03-05 ENCOUNTER — Ambulatory Visit (INDEPENDENT_AMBULATORY_CARE_PROVIDER_SITE_OTHER): Payer: Medicare Other | Admitting: Family Medicine

## 2017-03-05 VITALS — BP 132/81 | HR 76 | Temp 99.1°F | Ht 70.0 in | Wt 252.4 lb

## 2017-03-05 DIAGNOSIS — R229 Localized swelling, mass and lump, unspecified: Secondary | ICD-10-CM

## 2017-03-05 DIAGNOSIS — E781 Pure hyperglyceridemia: Secondary | ICD-10-CM | POA: Diagnosis not present

## 2017-03-05 NOTE — Progress Notes (Signed)
   HPI  Patient presents today here with a skin nodule the face.  Patient describes a "lump on the face" that is slightly tender and possibly present around a year.  Patient states that it's slightly tender, it has not changed rapidly recently. He denies any pain with chewing  PMH: Smoking status noted ROS: Per HPI  Objective: BP 132/81   Pulse 76   Temp 99.1 F (37.3 C) (Oral)   Ht 5\' 10"  (1.778 m)   Wt 252 lb 6.4 oz (114.5 kg)   BMI 36.22 kg/m  Gen: NAD, alert, cooperative with exam HEENT: NCAT CV: RRR, good S1/S2, no murmur Resp: CTABL, no wheezes, non-labored Abd: SNTND, BS present, no guarding or organomegaly Ext: No edema, warm Neuro: Alert and oriented, No gross deficits  Skin:  Soft fluctuant subq lesion on the R just above the TMJ  Assessment and plan:  # Subcutaneous nodule Most likely salivary cyst, however it is not firm and is only mildly tender on exam Given duration of symptoms I would recommend evaluation by ENT Referral placed.   # Hypertriglyceridemia Patient is tolerating fenofibrate well, no side effects     Orders Placed This Encounter  Procedures  . Ambulatory referral to ENT    Referral Priority:   Routine    Referral Type:   Consultation    Referral Reason:   Specialty Services Required    Requested Specialty:   Otolaryngology    Number of Visits Requested:   1    No orders of the defined types were placed in this encounter.   Laroy Apple, MD [facility] 03/05/2017, 3:32 PM

## 2017-03-05 NOTE — Patient Instructions (Signed)
Great to see you! We will work on a referral and call you with an appointment

## 2017-03-17 ENCOUNTER — Other Ambulatory Visit: Payer: Self-pay | Admitting: Family Medicine

## 2017-04-03 ENCOUNTER — Other Ambulatory Visit: Payer: Self-pay | Admitting: Family Medicine

## 2017-04-11 DIAGNOSIS — G8929 Other chronic pain: Secondary | ICD-10-CM | POA: Diagnosis not present

## 2017-04-11 DIAGNOSIS — M5441 Lumbago with sciatica, right side: Secondary | ICD-10-CM | POA: Diagnosis not present

## 2017-04-14 ENCOUNTER — Telehealth: Payer: Self-pay | Admitting: Family Medicine

## 2017-04-14 MED ORDER — DULOXETINE HCL 60 MG PO CPEP: ORAL_CAPSULE | ORAL | 0 refills | Status: DC

## 2017-04-14 NOTE — Telephone Encounter (Signed)
Pt aware refill sent to pharmacy 

## 2017-04-14 NOTE — Telephone Encounter (Signed)
What is the name of the medication? Duloxetine  Have you contacted your pharmacy to request a refill? yes  Which pharmacy would you like this sent to? [facility] in [place] pt would like 3 refills, he says every month he goes a day or two without meds   Patient notified that their request is being sent to the clinical staff for review and that they should receive a call once it is complete. If they do not receive a call within 24 hours they can check with their pharmacy or our office.

## 2017-04-18 DIAGNOSIS — M5441 Lumbago with sciatica, right side: Secondary | ICD-10-CM | POA: Diagnosis not present

## 2017-04-18 DIAGNOSIS — G8929 Other chronic pain: Secondary | ICD-10-CM | POA: Diagnosis not present

## 2017-04-24 ENCOUNTER — Other Ambulatory Visit: Payer: Self-pay | Admitting: Family Medicine

## 2017-04-24 DIAGNOSIS — M4316 Spondylolisthesis, lumbar region: Secondary | ICD-10-CM | POA: Diagnosis not present

## 2017-04-24 DIAGNOSIS — I10 Essential (primary) hypertension: Secondary | ICD-10-CM

## 2017-04-24 DIAGNOSIS — M4807 Spinal stenosis, lumbosacral region: Secondary | ICD-10-CM | POA: Diagnosis not present

## 2017-04-25 ENCOUNTER — Encounter: Payer: Self-pay | Admitting: Family Medicine

## 2017-04-25 ENCOUNTER — Ambulatory Visit (INDEPENDENT_AMBULATORY_CARE_PROVIDER_SITE_OTHER): Payer: Medicare Other | Admitting: Family Medicine

## 2017-04-25 VITALS — BP 157/97 | HR 60 | Temp 97.4°F | Ht 70.0 in | Wt 282.8 lb

## 2017-04-25 DIAGNOSIS — M48061 Spinal stenosis, lumbar region without neurogenic claudication: Secondary | ICD-10-CM

## 2017-04-25 DIAGNOSIS — E781 Pure hyperglyceridemia: Secondary | ICD-10-CM

## 2017-04-25 DIAGNOSIS — I10 Essential (primary) hypertension: Secondary | ICD-10-CM | POA: Diagnosis not present

## 2017-04-25 MED ORDER — MELOXICAM 15 MG PO TABS: 15.0000 mg | ORAL_TABLET | Freq: Every day | ORAL | 1 refills | Status: DC

## 2017-04-25 MED ORDER — DULOXETINE HCL 60 MG PO CPEP: ORAL_CAPSULE | ORAL | 3 refills | Status: DC

## 2017-04-25 MED ORDER — METOPROLOL TARTRATE 50 MG PO TABS: 50.0000 mg | ORAL_TABLET | Freq: Two times a day (BID) | ORAL | 3 refills | Status: AC

## 2017-04-25 NOTE — Progress Notes (Signed)
   HPI  Patient presents today here to follow-up for chronic medical conditions.  Patient has long-term difficulty with chronic back pain and chronic neck pain. Patient has history of bilateral carpal tunnel syndrome with chronic hand numbness and tingling after the surgical correction. Patient has recent MRI from orthopedic showing multiple areas of concern, he does not want surgical correction.  Hypertriglyceridemia Fasting today except for a small amount of coffee creamer. Good medication compliance with fenofibrate, tolerating well.  Hypertension Elevated today, however pain is uncontrolled, needs refill of meloxicam.  We discussed the risks and benefits of taking meloxicam chronically.  Continue PPI.  PMH: Smoking status noted ROS: Per HPI  Objective: BP (!) 157/97   Pulse 60   Temp (!) 97.4 F (36.3 C) (Oral)   Ht '5\' 10"'$  (1.778 m)   Wt 282 lb 12.8 oz (128.3 kg)   BMI 40.58 kg/m  Gen: NAD, alert, cooperative with exam HEENT: NCAT CV: RRR, good S1/S2, no murmur Resp: CTABL, no wheezes, non-labored Ext: No edema, warm Neuro: Alert and oriented, No gross deficits  Assessment and plan:  #Hypertension Elevated Likely due to uncontrolled pain. No changes for now, also he is out of metoprolol  #Spinal stenosis, chronic back pain Refer to pain management.  # Hypertriglyceridemia.  Tolerating fibrate well, no changes Labs today.     Orders Placed This Encounter  Procedures  . CMP14+EGFR  . Lipid panel  . Ambulatory referral to Pain Clinic    Referral Priority:   Routine    Referral Type:   Consultation    Referral Reason:   Specialty Services Required    Requested Specialty:   Pain Medicine    Number of Visits Requested:   1    Meds ordered this encounter  Medications  . metoprolol tartrate (LOPRESSOR) 50 MG tablet    Sig: Take 1 tablet (50 mg total) by mouth 2 (two) times daily.    Dispense:  180 tablet    Refill:  3  . DULoxetine (CYMBALTA) 60 MG  capsule    Sig: TAKE 1 CAPSULE BY MOUTH ONCE DAILY AT BEDTIME    Dispense:  90 capsule    Refill:  3  . meloxicam (MOBIC) 15 MG tablet    Sig: Take 1 tablet (15 mg total) by mouth daily.    Dispense:  90 tablet    Refill:  1    Please consider 90 day supplies to promote better adherence    Laroy Apple, MD [facility] 04/25/2017, 2:33 PM

## 2017-04-25 NOTE — Patient Instructions (Signed)
Great to see you!  We are working on a referral to pain management  We will call with or send results on Mychart or by letter.

## 2017-04-26 LAB — CMP14+EGFR
A/G RATIO: 2.1 (ref 1.2–2.2)
ALK PHOS: 45 IU/L (ref 39–117)
ALT: 12 IU/L (ref 0–44)
AST: 24 IU/L (ref 0–40)
Albumin: 4.6 g/dL (ref 3.6–4.8)
BUN/Creatinine Ratio: 11 (ref 10–24)
BUN: 16 mg/dL (ref 8–27)
Bilirubin Total: 0.3 mg/dL (ref 0.0–1.2)
CO2: 20 mmol/L (ref 20–29)
Calcium: 9.7 mg/dL (ref 8.6–10.2)
Chloride: 103 mmol/L (ref 96–106)
Creatinine, Ser: 1.46 mg/dL — ABNORMAL HIGH (ref 0.76–1.27)
GFR calc Af Amer: 59 mL/min/{1.73_m2} — ABNORMAL LOW (ref >59)
GFR, EST NON AFRICAN AMERICAN: 51 mL/min/{1.73_m2} — AB (ref >59)
GLOBULIN, TOTAL: 2.2 g/dL (ref 1.5–4.5)
Glucose: 88 mg/dL (ref 65–99)
POTASSIUM: 4 mmol/L (ref 3.5–5.2)
SODIUM: 140 mmol/L (ref 134–144)
Total Protein: 6.8 g/dL (ref 6.0–8.5)

## 2017-04-26 LAB — LIPID PANEL
CHOL/HDL RATIO: 4.9 ratio (ref 0.0–5.0)
CHOLESTEROL TOTAL: 204 mg/dL — AB (ref 100–199)
HDL: 42 mg/dL (ref >39)
LDL Calculated: 110 mg/dL — ABNORMAL HIGH (ref 0–99)
TRIGLYCERIDES: 259 mg/dL — AB (ref 0–149)
VLDL Cholesterol Cal: 52 mg/dL — ABNORMAL HIGH (ref 5–40)

## 2017-04-29 ENCOUNTER — Other Ambulatory Visit: Payer: Self-pay

## 2017-04-29 ENCOUNTER — Telehealth: Payer: Self-pay | Admitting: Family Medicine

## 2017-04-29 DIAGNOSIS — N289 Disorder of kidney and ureter, unspecified: Secondary | ICD-10-CM

## 2017-04-29 NOTE — Progress Notes (Unsigned)
bmp 

## 2017-04-29 NOTE — Telephone Encounter (Signed)
Patient informed of lab results. 

## 2017-05-01 ENCOUNTER — Other Ambulatory Visit: Payer: Self-pay | Admitting: Family Medicine

## 2017-05-01 DIAGNOSIS — J310 Chronic rhinitis: Secondary | ICD-10-CM | POA: Diagnosis not present

## 2017-05-01 DIAGNOSIS — R220 Localized swelling, mass and lump, head: Secondary | ICD-10-CM | POA: Diagnosis not present

## 2017-05-01 DIAGNOSIS — G4733 Obstructive sleep apnea (adult) (pediatric): Secondary | ICD-10-CM | POA: Diagnosis not present

## 2017-05-05 DIAGNOSIS — Z79899 Other long term (current) drug therapy: Secondary | ICD-10-CM | POA: Diagnosis not present

## 2017-05-05 DIAGNOSIS — Z1389 Encounter for screening for other disorder: Secondary | ICD-10-CM | POA: Diagnosis not present

## 2017-05-05 DIAGNOSIS — G8929 Other chronic pain: Secondary | ICD-10-CM | POA: Diagnosis not present

## 2017-05-05 DIAGNOSIS — I10 Essential (primary) hypertension: Secondary | ICD-10-CM | POA: Diagnosis not present

## 2017-05-05 DIAGNOSIS — M129 Arthropathy, unspecified: Secondary | ICD-10-CM | POA: Diagnosis not present

## 2017-05-05 DIAGNOSIS — M545 Low back pain: Secondary | ICD-10-CM | POA: Diagnosis not present

## 2017-05-05 DIAGNOSIS — M542 Cervicalgia: Secondary | ICD-10-CM | POA: Diagnosis not present

## 2017-05-09 ENCOUNTER — Other Ambulatory Visit: Payer: Self-pay | Admitting: Family Medicine

## 2017-05-12 NOTE — Telephone Encounter (Signed)
Refill called to [facility] [place] [facility]

## 2017-05-13 DIAGNOSIS — M545 Low back pain: Secondary | ICD-10-CM | POA: Diagnosis not present

## 2017-05-13 DIAGNOSIS — M542 Cervicalgia: Secondary | ICD-10-CM | POA: Diagnosis not present

## 2017-05-13 DIAGNOSIS — M5137 Other intervertebral disc degeneration, lumbosacral region: Secondary | ICD-10-CM | POA: Diagnosis not present

## 2017-05-13 DIAGNOSIS — G8929 Other chronic pain: Secondary | ICD-10-CM | POA: Diagnosis not present

## 2017-05-15 ENCOUNTER — Ambulatory Visit (INDEPENDENT_AMBULATORY_CARE_PROVIDER_SITE_OTHER): Payer: Medicare Other | Admitting: Family Medicine

## 2017-05-15 ENCOUNTER — Encounter: Payer: Self-pay | Admitting: Family Medicine

## 2017-05-15 VITALS — BP 120/80 | HR 67 | Temp 96.9°F | Ht 70.0 in | Wt 245.4 lb

## 2017-05-15 DIAGNOSIS — R7989 Other specified abnormal findings of blood chemistry: Secondary | ICD-10-CM | POA: Diagnosis not present

## 2017-05-15 NOTE — Patient Instructions (Signed)
Great to see you!   

## 2017-05-15 NOTE — Progress Notes (Signed)
   HPI  Patient presents today here to discuss CKD.  Patient was recently seen at pain management, he was told that he has chronic kidney disease stage III.  He is very concerned. He is avoiding NSAIDs, has discontinued meloxicam as discussed last visit. His pain is uncontrolled without meloxicam, Tylenol helps somewhat.  He has been started on hydrocodone.  PMH: Smoking status noted ROS: Per HPI  Objective: BP 120/80   Pulse 67   Temp (!) 96.9 F (36.1 C) (Oral)   Ht '5\' 10"'$  (1.778 m)   Wt 245 lb 6.4 oz (111.3 kg)   BMI 35.21 kg/m  Gen: NAD, alert, cooperative with exam HEENT: NCAT CV: RRR, good S1/S2, no murmur Resp: CTABL, no wheezes, non-labored Ext: No edema, warm Neuro: Alert and oriented, No gross deficits  Assessment and plan:  #Elevated creatinine Patient with single GFR in the CKD 3 range, discussed with patient, avoid NSAIDs Encourage fluids Check all new prescriptions for renal impact. Reassurance provided, recheck labs today   Orders Placed This Encounter  Procedures  . Raymond, MD [facility] 05/15/2017, 5:16 PM

## 2017-05-16 LAB — CMP14+EGFR
ALBUMIN: 4.5 g/dL (ref 3.6–4.8)
ALK PHOS: 48 IU/L (ref 39–117)
ALT: 8 IU/L (ref 0–44)
AST: 18 IU/L (ref 0–40)
Albumin/Globulin Ratio: 2 (ref 1.2–2.2)
BILIRUBIN TOTAL: 0.3 mg/dL (ref 0.0–1.2)
BUN / CREAT RATIO: 13 (ref 10–24)
BUN: 22 mg/dL (ref 8–27)
CHLORIDE: 96 mmol/L (ref 96–106)
CO2: 23 mmol/L (ref 20–29)
CREATININE: 1.64 mg/dL — AB (ref 0.76–1.27)
Calcium: 9.8 mg/dL (ref 8.6–10.2)
GFR calc non Af Amer: 44 mL/min/{1.73_m2} — ABNORMAL LOW (ref >59)
GFR, EST AFRICAN AMERICAN: 51 mL/min/{1.73_m2} — AB (ref >59)
GLOBULIN, TOTAL: 2.2 g/dL (ref 1.5–4.5)
Glucose: 92 mg/dL (ref 65–99)
Potassium: 4.3 mmol/L (ref 3.5–5.2)
SODIUM: 135 mmol/L (ref 134–144)
Total Protein: 6.7 g/dL (ref 6.0–8.5)

## 2017-05-27 DIAGNOSIS — G8929 Other chronic pain: Secondary | ICD-10-CM | POA: Diagnosis not present

## 2017-05-27 DIAGNOSIS — M5441 Lumbago with sciatica, right side: Secondary | ICD-10-CM | POA: Diagnosis not present

## 2017-05-27 DIAGNOSIS — M4807 Spinal stenosis, lumbosacral region: Secondary | ICD-10-CM | POA: Diagnosis not present

## 2017-05-27 DIAGNOSIS — M4316 Spondylolisthesis, lumbar region: Secondary | ICD-10-CM | POA: Diagnosis not present

## 2017-05-28 ENCOUNTER — Other Ambulatory Visit: Payer: Self-pay | Admitting: Family Medicine

## 2017-05-29 ENCOUNTER — Other Ambulatory Visit: Payer: Self-pay | Admitting: Family Medicine

## 2017-05-30 NOTE — Telephone Encounter (Signed)
Last seen 05/15/17  Dr Wendi Snipes

## 2017-05-30 NOTE — Telephone Encounter (Signed)
This can go to Dr. Bradshaw for approval 

## 2017-06-02 NOTE — Telephone Encounter (Signed)
Patient aware.

## 2017-06-11 DIAGNOSIS — N183 Chronic kidney disease, stage 3 (moderate): Secondary | ICD-10-CM | POA: Diagnosis not present

## 2017-06-11 DIAGNOSIS — Z79899 Other long term (current) drug therapy: Secondary | ICD-10-CM | POA: Diagnosis not present

## 2017-06-11 DIAGNOSIS — M5137 Other intervertebral disc degeneration, lumbosacral region: Secondary | ICD-10-CM | POA: Diagnosis not present

## 2017-06-11 DIAGNOSIS — Z1330 Encounter for screening examination for mental health and behavioral disorders, unspecified: Secondary | ICD-10-CM | POA: Diagnosis not present

## 2017-06-11 DIAGNOSIS — I10 Essential (primary) hypertension: Secondary | ICD-10-CM | POA: Diagnosis not present

## 2017-06-12 DIAGNOSIS — M5136 Other intervertebral disc degeneration, lumbar region: Secondary | ICD-10-CM | POA: Diagnosis not present

## 2017-06-12 DIAGNOSIS — M5416 Radiculopathy, lumbar region: Secondary | ICD-10-CM | POA: Diagnosis not present

## 2017-06-25 ENCOUNTER — Ambulatory Visit: Payer: Medicare Other | Admitting: Urology

## 2017-06-25 DIAGNOSIS — N5201 Erectile dysfunction due to arterial insufficiency: Secondary | ICD-10-CM

## 2017-07-03 ENCOUNTER — Ambulatory Visit (INDEPENDENT_AMBULATORY_CARE_PROVIDER_SITE_OTHER): Payer: Medicare Other | Admitting: *Deleted

## 2017-07-03 VITALS — BP 131/80 | HR 64 | Temp 96.7°F | Ht 70.0 in | Wt 247.0 lb

## 2017-07-03 DIAGNOSIS — Z0000 Encounter for general adult medical examination without abnormal findings: Secondary | ICD-10-CM | POA: Diagnosis not present

## 2017-07-03 NOTE — Progress Notes (Signed)
Subjective:   David Simon is a 62 y.o. male who presents for Medicare Annual/Subsequent preventive examination. 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 His job was based in [place], but traveled a lot. He enjoys metal detecting, taking is dogs to the lake, bike-riding and coin collecting. For exercise he walk and rides a stationary bike for about 30 minutes 4 times a week. He states that is diet is fairly healthy. He says he eats fresh vegetables and butcher meats. He doesn't eat anything processed or fast food. He typically eats 2 meals a day. He is a avid Banker and he goes about three times a week. He lives at home with his girlfriend and her two children. They have 3 dogs. Fall hazards and risks were discussed today. He states that he has had some recent kidney issues as well as shoulder and back pain, which makes his say that his health is worse than it was a year ago.    Cardiac Risk Factors include: advanced age (>34men, >73 women);dyslipidemia;hypertension;male gender     Objective:    Vitals: BP 131/80 (BP Location: Left Arm)   Pulse 64   Temp (!) 96.7 F (35.9 C) (Oral)   Ht 5\' 10"  (1.778 m)   Wt 247 lb (112 kg)   BMI 35.44 kg/m   Body mass index is 35.44 kg/m.  Advanced Directives 07/03/2017 03/14/2016 03/29/2015  Does Patient Have a Medical Advance Directive? No No No  Would patient like information on creating a medical advance directive? Yes (MAU/Ambulatory/Procedural Areas - Information given) Yes - Educational materials given -    Tobacco Social History   Tobacco Use  Smoking Status Current Some Day Smoker  . Packs/day: 0.00  . Types: Cigars  Smokeless Tobacco Never Used     Ready to quit: Not Answered Counseling given: Not Answered   Clinical Intake:                       Past Medical History:  Diagnosis Date  . Allergy   . Anxiety    anti depressants help   . Arthritis   . Asthma    as a child   . Depression   . Essential  hypertension   . Fracture of neck ([place])    Traumatic event 2008  . History of diabetes mellitus    as a child - no longer has   . Hypercholesterolemia   . Seasonal allergies   . Sleep apnea   . Spinal stenosis    herniated disc, pinched nerve  --- pain clinic and [facility])   Past Surgical History:  Procedure Laterality Date  . CARPAL TUNNEL RELEASE Bilateral   . NASAL SINUS SURGERY     x2   . TONSILLECTOMY     Family History  Problem Relation Age of Onset  . Hypertension Mother   . High Cholesterol Mother   . Arthritis Mother   . Cancer Mother        bladder   . Other Mother        oxygen use   . Heart disease Father   . Heart attack Father   . Hyperlipidemia Sister   . Hypertension Sister   . Hypertension Brother   . Hyperlipidemia Brother   . Arthritis Brother   . Diabetes Maternal Grandmother   . Heart attack Maternal Grandfather   . Tuberculosis Paternal Grandmother        and other  breathing problems  . Heart attack Paternal Grandfather    Social History   Socioeconomic History  . Marital status: Divorced    Spouse name: None  . Number of children: 1  . Years of education: None  . Highest education level: None  Social Needs  . Financial resource strain: None  . Food insecurity - worry: None  . Food insecurity - inability: None  . Transportation needs - medical: None  . Transportation needs - non-medical: None  Occupational History  . Occupation: diability    Comment: union iron worker  - [place]   Tobacco Use  . Smoking status: Current Some Day Smoker    Packs/day: 0.00    Types: Cigars  . Smokeless tobacco: Never Used  Substance and Sexual Activity  . Alcohol use: Yes    Alcohol/week: 0.0 oz    Comment: very seldom  . Drug use: No  . Sexual activity: Yes  Other Topics Concern  . None  Social History Narrative  . None    Outpatient Encounter Medications as of 07/03/2017  Medication Sig  . amLODipine (NORVASC) 10 MG tablet TAKE ONE  TABLET BY MOUTH ONCE DAILY  . atorvastatin (LIPITOR) 10 MG tablet TAKE ONE TABLET BY MOUTH ONCE DAILY  . DULoxetine (CYMBALTA) 60 MG capsule TAKE 1 CAPSULE BY MOUTH ONCE DAILY AT BEDTIME  . fenofibrate 54 MG tablet Take 1 tablet (54 mg total) by mouth daily.  . fluticasone (FLONASE) 50 MCG/ACT nasal spray Place into both nostrils daily.  Marland Kitchen HYDROcodone-acetaminophen (NORCO/VICODIN) 5-325 MG tablet Take 1 tablet by mouth 3 (three) times daily as needed. Takes 1 a day usually  . levocetirizine (XYZAL) 5 MG tablet TAKE ONE TABLET BY MOUTH EVERY EVENING  . lisinopril-hydrochlorothiazide (PRINZIDE,ZESTORETIC) 20-25 MG tablet TAKE 1 TABLET BY MOUTH ONCE DAILY  . metoprolol tartrate (LOPRESSOR) 50 MG tablet Take 1 tablet (50 mg total) by mouth 2 (two) times daily.  . montelukast (SINGULAIR) 10 MG tablet TAKE 1 TABLET BY MOUTH ONCE DAILY AT BEDTIME  . pantoprazole (PROTONIX) 40 MG tablet TAKE 1 TABLET BY MOUTH ONCE DAILY  . risperiDONE (RISPERDAL) 0.5 MG tablet TAKE 1 TABLET BY MOUTH ONCE DAILY AT BEDTIME  . sildenafil (REVATIO) 20 MG tablet TAKE 2 TO 5 TABLETS BY MOUTH DAILY AS NEEDED FOR ERECTILE DYSFUNCTION.  . trazodone (DESYREL) 300 MG tablet TAKE 1 TABLET BY MOUTH AT BEDTIME AS NEEDED FOR SLEEP  . [DISCONTINUED] meloxicam (MOBIC) 15 MG tablet Take 1 tablet (15 mg total) by mouth daily.   No facility-administered encounter medications on file as of 07/03/2017.     Activities of Daily Living In your present state of health, do you have any difficulty performing the following activities: 07/03/2017  Hearing? N  Vision? Y  Comment wears reading glasses   Difficulty concentrating or making decisions? N  Walking or climbing stairs? Y  Comment back pain   Dressing or bathing? Y  Comment sometimes due to back and shoulder pain   Doing errands, shopping? N  Preparing Food and eating ? N  Using the Toilet? N  In the past six months, have you accidently leaked urine? N  Do you have problems with loss  of bowel control? N  Managing your Medications? N  Managing your Finances? N  Housekeeping or managing your Housekeeping? Y  Comment girlfriend helps   Some recent data might be hidden    Patient Care Team: Elenore Paddy, FNP as PCP - General (Family Medicine) Jerrell Belfast, MD as  Consulting Physician (Otolaryngology) Satira Sark, MD as Consulting Physician (Cardiology) Suella Broad, MD as Consulting Physician (Physical Medicine and Rehabilitation)   Assessment:   This is a routine wellness examination for David Simon.  Exercise Activities and Dietary recommendations Current Exercise Habits: Home exercise routine, Type of exercise: walking;Other - see comments(bike ), Time (Minutes): 30, Frequency (Times/Week): 4, Weekly Exercise (Minutes/Week): 120, Intensity: Mild, Exercise limited by: orthopedic condition(s)  Goals    . Weight (lb) < 210 lb (95.3 kg)     Work out more, lose weight - be more mobile        Fall Risk Fall Risk  07/03/2017 05/15/2017 04/25/2017 03/05/2017 01/23/2017  Falls in the past year? No No No No No   Is the patient's home free of loose throw rugs in walkways, pet beds, electrical cords, etc? Fall risk and hazards were discussed today    Depression Screen PHQ 2/9 Scores 07/03/2017 05/15/2017 04/25/2017 03/05/2017  PHQ - 2 Score 0 0 0 0  PHQ- 9 Score - - - -    Cognitive Function MMSE - Mini Mental State Exam 07/03/2017 03/14/2016  Orientation to time 5 5  Orientation to Place 5 5  Registration 3 -  Attention/ Calculation 5 -  Recall 3 -  Language- name 2 objects 2 -  Language- repeat 1 -  Language- follow 3 step command 3 -  Language- read & follow direction 1 -  Write a sentence 1 -  Copy design 1 -  Total score 30 -        Immunization History  Administered Date(s) Administered  . Influenza,inj,Quad PF,6+ Mos 03/31/2017  . Influenza-Unspecified 03/01/2016    Qualifies for Shingles Vaccine?he declines it at this time  Screening  Tests Health Maintenance  Topic Date Due  . COLON CANCER SCREENING ANNUAL FOBT  07/31/2017 (Originally 06/07/2016)  . HIV Screening  07/03/2018 (Originally 07/11/1970)  . TETANUS/TDAP  07/03/2022  . INFLUENZA VACCINE  Completed  . Hepatitis C Screening  Completed   Cancer Screenings: Lung: Low Dose CT Chest recommended if Age 67-80 years, 30 pack-year currently smoking OR have quit w/in 15years. Patient does qualify. Colorectal: has a home =will bring in to his next appt  Additional Screenings:  Hepatitis B/HIV/Syphillis: denied Hepatitis C Screening: this has been done    Plan:   pt is to follow up with DR Wendi Snipes He needs to schedule a appt for an eye exam He wants to think about getting the prevnar 13 and will decide before his Jan 15 th appt. He will do a FOBT card and bring this in on the 15th as well.   I have personally reviewed and noted the following in the patient's chart:   . Medical and social history . Use of alcohol, tobacco or illicit drugs  . Current medications and supplements . Functional ability and status . Nutritional status . Physical activity . Advanced directives . List of other physicians . Hospitalizations, surgeries, and ER visits in previous 12 months . Vitals . Screenings to include cognitive, depression, and falls . Referrals and appointments  In addition, I have reviewed and discussed with patient certain preventive protocols, quality metrics, and best practice recommendations. A written personalized care plan for preventive services as well as general preventive health recommendations were provided to patient.     David Simon, Cameron Proud, LPN  11/01/6142   I have reviewed and agree with the above AWV documentation.   Laroy Apple, MD [facility] 07/10/2017, 11:46  AM

## 2017-07-03 NOTE — Patient Instructions (Signed)
  David Simon , Thank you for taking time to come for your Medicare Wellness Visit. I appreciate your ongoing commitment to your health goals. Please review the following plan we discussed and let me know if I can assist you in the future.   These are the goals we discussed: Goals    . Weight (lb) < 210 lb (95.3 kg)     Work out more, lose weight - be more mobile        This is a list of the screening recommended for you and due dates:  Health Maintenance  Topic Date Due  . Stool Blood Test  07/31/2017*  . HIV Screening  07/03/2018*  . Tetanus Vaccine  07/03/2022  . Flu Shot  Completed  .  Hepatitis C: One time screening is recommended by Center for Disease Control  (CDC) for  adults born from [birth year] through 1965.   Completed  *Topic was postponed. The date shown is not the original due date.    Review and fill out the advanced directives Follow up with Dr Wendi Snipes and other specialist  Try and schedule a eye exam  Think about the prevar 37 (baby pneumonia vaccine ) this is free with Medicare and you can get this at your next visit.

## 2017-07-07 DIAGNOSIS — M542 Cervicalgia: Secondary | ICD-10-CM | POA: Diagnosis not present

## 2017-07-07 DIAGNOSIS — M5137 Other intervertebral disc degeneration, lumbosacral region: Secondary | ICD-10-CM | POA: Diagnosis not present

## 2017-07-11 DIAGNOSIS — M5137 Other intervertebral disc degeneration, lumbosacral region: Secondary | ICD-10-CM | POA: Diagnosis not present

## 2017-07-11 DIAGNOSIS — I10 Essential (primary) hypertension: Secondary | ICD-10-CM | POA: Diagnosis not present

## 2017-07-11 DIAGNOSIS — M542 Cervicalgia: Secondary | ICD-10-CM | POA: Diagnosis not present

## 2017-07-11 DIAGNOSIS — Z79899 Other long term (current) drug therapy: Secondary | ICD-10-CM | POA: Diagnosis not present

## 2017-07-11 DIAGNOSIS — M5030 Other cervical disc degeneration, unspecified cervical region: Secondary | ICD-10-CM | POA: Diagnosis not present

## 2017-07-15 ENCOUNTER — Encounter: Payer: Self-pay | Admitting: Family Medicine

## 2017-07-15 ENCOUNTER — Ambulatory Visit (INDEPENDENT_AMBULATORY_CARE_PROVIDER_SITE_OTHER): Payer: Medicare Other | Admitting: Family Medicine

## 2017-07-15 VITALS — BP 128/80 | HR 64 | Temp 96.8°F | Ht 70.0 in | Wt 242.8 lb

## 2017-07-15 DIAGNOSIS — E781 Pure hyperglyceridemia: Secondary | ICD-10-CM

## 2017-07-15 DIAGNOSIS — Z0000 Encounter for general adult medical examination without abnormal findings: Secondary | ICD-10-CM | POA: Diagnosis not present

## 2017-07-15 DIAGNOSIS — E785 Hyperlipidemia, unspecified: Secondary | ICD-10-CM | POA: Diagnosis not present

## 2017-07-15 DIAGNOSIS — E669 Obesity, unspecified: Secondary | ICD-10-CM

## 2017-07-15 NOTE — Progress Notes (Signed)
   HPI  Patient presents today for an annual physical exam.  Patient has multiple updates, he is recently been found to have low testosterone by urology and will be started on testosterone replacement.  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  Patient has good medication compliance in general, he has stopped amlodipine due to blood pressures that were in the 80s. He feels better after stopping it.  He has begun working out 30 minutes a day almost every day.  He is watching his diet and his sodium intake strictly.  PMH: Smoking status noted ROS: Per HPI  Objective: BP 128/80   Pulse 64   Temp (!) 96.8 F (36 C) (Oral)   Ht '5\' 10"'$  (1.778 m)   Wt 242 lb 12.8 oz (110.1 kg)   BMI 34.84 kg/m  Gen: NAD, alert, cooperative with exam HEENT: NCAT, EOMI, PERRL, oropharynx moist and clear, CV: RRR, good S1/S2, no murmur Resp: CTABL, no wheezes, non-labored Abd: SNTND, BS present, no guarding or organomegaly Ext: No edema, warm Neuro: Alert and oriented, No gross deficits  Assessment and plan:  #Annual physical exam Normal exam except for weight, however that is improving. Patient has made positive lifestyle changes, congratulated Fasting labs today, I explained that it is a bit earlier than my typical repeat cholesterol, however he would like to see his results  #Hyperlipidemia, hypertriglyceridemia Tolerating medications well, watching diet Fasting labs today  #Obesity Improving, congratulated   Orders Placed This Encounter  Procedures  . CMP14+EGFR  . CBC with Differential/Platelet  . Lipid panel  . TSH     Laroy Apple, MD [facility] 07/15/2017, 10:16 AM

## 2017-07-15 NOTE — Patient Instructions (Signed)
Great to see you!  Come back in 4-6 months unless you need [place] sooner.   You are doing great, keep up the hard work!

## 2017-07-16 ENCOUNTER — Other Ambulatory Visit: Payer: Self-pay | Admitting: Family Medicine

## 2017-07-16 LAB — CBC WITH DIFFERENTIAL/PLATELET
BASOS ABS: 0.1 10*3/uL (ref 0.0–0.2)
Basos: 1 %
EOS (ABSOLUTE): 0.3 10*3/uL (ref 0.0–0.4)
Eos: 5 %
Hematocrit: 37.8 % (ref 37.5–51.0)
Hemoglobin: 12.9 g/dL — ABNORMAL LOW (ref 13.0–17.7)
IMMATURE GRANULOCYTES: 0 %
Immature Grans (Abs): 0 10*3/uL (ref 0.0–0.1)
LYMPHS ABS: 1.6 10*3/uL (ref 0.7–3.1)
Lymphs: 30 %
MCH: 28.3 pg (ref 26.6–33.0)
MCHC: 34.1 g/dL (ref 31.5–35.7)
MCV: 83 fL (ref 79–97)
MONOCYTES: 9 %
MONOS ABS: 0.5 10*3/uL (ref 0.1–0.9)
NEUTROS PCT: 55 %
Neutrophils Absolute: 3 10*3/uL (ref 1.4–7.0)
Platelets: 286 10*3/uL (ref 150–379)
RBC: 4.56 x10E6/uL (ref 4.14–5.80)
RDW: 13.7 % (ref 12.3–15.4)
WBC: 5.3 10*3/uL (ref 3.4–10.8)

## 2017-07-16 LAB — CMP14+EGFR
A/G RATIO: 2.2 (ref 1.2–2.2)
ALK PHOS: 42 IU/L (ref 39–117)
ALT: 8 IU/L (ref 0–44)
AST: 16 IU/L (ref 0–40)
Albumin: 4.3 g/dL (ref 3.6–4.8)
BUN/Creatinine Ratio: 15 (ref 10–24)
BUN: 22 mg/dL (ref 8–27)
Bilirubin Total: 0.3 mg/dL (ref 0.0–1.2)
CALCIUM: 9.8 mg/dL (ref 8.6–10.2)
CHLORIDE: 100 mmol/L (ref 96–106)
CO2: 21 mmol/L (ref 20–29)
Creatinine, Ser: 1.45 mg/dL — ABNORMAL HIGH (ref 0.76–1.27)
GFR calc Af Amer: 59 mL/min/{1.73_m2} — ABNORMAL LOW (ref >59)
GFR calc non Af Amer: 51 mL/min/{1.73_m2} — ABNORMAL LOW (ref >59)
GLOBULIN, TOTAL: 2 g/dL (ref 1.5–4.5)
Glucose: 93 mg/dL (ref 65–99)
Potassium: 4.2 mmol/L (ref 3.5–5.2)
SODIUM: 138 mmol/L (ref 134–144)
Total Protein: 6.3 g/dL (ref 6.0–8.5)

## 2017-07-16 LAB — TSH: TSH: 1.54 u[IU]/mL (ref 0.450–4.500)

## 2017-07-16 LAB — LIPID PANEL
CHOLESTEROL TOTAL: 199 mg/dL (ref 100–199)
Chol/HDL Ratio: 5.5 ratio — ABNORMAL HIGH (ref 0.0–5.0)
HDL: 36 mg/dL — ABNORMAL LOW (ref >39)
LDL Calculated: 110 mg/dL — ABNORMAL HIGH (ref 0–99)
TRIGLYCERIDES: 267 mg/dL — AB (ref 0–149)
VLDL Cholesterol Cal: 53 mg/dL — ABNORMAL HIGH (ref 5–40)

## 2017-07-31 DIAGNOSIS — R948 Abnormal results of function studies of other organs and systems: Secondary | ICD-10-CM | POA: Diagnosis not present

## 2017-07-31 DIAGNOSIS — E291 Testicular hypofunction: Secondary | ICD-10-CM | POA: Diagnosis not present

## 2017-08-06 ENCOUNTER — Other Ambulatory Visit: Payer: Self-pay | Admitting: Family Medicine

## 2017-08-12 DIAGNOSIS — M542 Cervicalgia: Secondary | ICD-10-CM | POA: Diagnosis not present

## 2017-08-12 DIAGNOSIS — M545 Low back pain: Secondary | ICD-10-CM | POA: Diagnosis not present

## 2017-08-12 DIAGNOSIS — G8929 Other chronic pain: Secondary | ICD-10-CM | POA: Diagnosis not present

## 2017-08-12 DIAGNOSIS — M5137 Other intervertebral disc degeneration, lumbosacral region: Secondary | ICD-10-CM | POA: Diagnosis not present

## 2017-08-12 DIAGNOSIS — Z79899 Other long term (current) drug therapy: Secondary | ICD-10-CM | POA: Diagnosis not present

## 2017-08-13 ENCOUNTER — Other Ambulatory Visit: Payer: Self-pay | Admitting: Family Medicine

## 2017-09-03 ENCOUNTER — Other Ambulatory Visit: Payer: Self-pay | Admitting: Family Medicine

## 2017-09-03 DIAGNOSIS — I10 Essential (primary) hypertension: Secondary | ICD-10-CM

## 2017-09-05 ENCOUNTER — Encounter: Payer: Self-pay | Admitting: Family Medicine

## 2017-09-05 ENCOUNTER — Ambulatory Visit (INDEPENDENT_AMBULATORY_CARE_PROVIDER_SITE_OTHER): Payer: Medicare Other | Admitting: Family Medicine

## 2017-09-05 VITALS — BP 122/74 | HR 64 | Temp 98.0°F | Ht 70.0 in | Wt 241.0 lb

## 2017-09-05 DIAGNOSIS — R1011 Right upper quadrant pain: Secondary | ICD-10-CM

## 2017-09-05 DIAGNOSIS — Z20828 Contact with and (suspected) exposure to other viral communicable diseases: Secondary | ICD-10-CM

## 2017-09-05 DIAGNOSIS — L0293 Carbuncle, unspecified: Secondary | ICD-10-CM | POA: Diagnosis not present

## 2017-09-05 MED ORDER — DOXYCYCLINE HYCLATE 100 MG PO TABS: 100.0000 mg | ORAL_TABLET | Freq: Two times a day (BID) | ORAL | 0 refills | Status: DC

## 2017-09-05 NOTE — Patient Instructions (Signed)
Great to see you!  I would recommend any of the following providers for you : Dr. Warrick Parisian Dr. Lajuana Ripple Dr. Livia Snellen

## 2017-09-05 NOTE — Progress Notes (Signed)
   HPI  Patient presents today with rash.  Patient explains that for the last month or so he has had a come and go area of red painful bumps that have come up on his right flank.  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  He states that at that time he was diagnosed with MRSA and had good response to doxycycline. He denies fever, chills, sweats.  If he scratches aggressively he has a small amount of pus come out.  She also complains of right upper quadrant discomfort, happens mostly when he is lying back and trying to sit up. He uses oxycodone for chronic pain prescribed by pain management.  He is also been exposed to mono, he feels slightly under the weather and states that he may have it as well.  He does not play any contact sports. His wife is ill, his stepson was diagnosed with mono about a month ago and is improving.  PMH: Smoking status noted ROS: Per HPI  Objective: BP 122/74   Pulse 64   Temp 98 F (36.7 C) (Oral)   Ht '5\' 10"'$  (1.778 m)   Wt 241 lb (109.3 kg)   BMI 34.58 kg/m  Gen: NAD, alert, cooperative with exam HEENT: NCAT, bilateral anterior cervical lymphadenopathy, oropharynx moist and clear CV: RRR, good S1/S2, no murmur Resp: CTABL, no wheezes, non-labored Abd: SNTND, BS present, no guarding or organomegaly Ext: No edema, warm Neuro: Alert and oriented, No gross deficits Skin: Right posterior lower back with 3-4 areas of raised erythematous lesions that are slightly tender to palpation with no pus or fluctuance, each of them have a small amount of induration   Assessment and plan:  #Carbuncle Patient very likely has very mild smoldering carbuncles on the right flank Treated with doxycycline He does have a history of MRSA  #Right upper quadrant discomfort Unlikely gallbladder disease, however he is concerned. No characteristic colicky pain after eating. Labs today, if abnormal consider  ultrasound  #Epstein-Barr virus exposure Patient with lymphadenopathy and feeling ill Discussed usual course of illness Labs   Orders Placed This Encounter  Procedures  . CMP14+EGFR  . Epstein-Barr virus VCA antibody panel    Meds ordered this encounter  Medications  . doxycycline (VIBRA-TABS) 100 MG tablet    Sig: Take 1 tablet (100 mg total) by mouth 2 (two) times daily. 1 po bid    Dispense:  20 tablet    Refill:  0    Laroy Apple, MD [facility] 09/05/2017, 11:16 AM

## 2017-09-06 LAB — CMP14+EGFR
ALT: 7 IU/L (ref 0–44)
AST: 21 IU/L (ref 0–40)
Albumin/Globulin Ratio: 2 (ref 1.2–2.2)
Albumin: 4.4 g/dL (ref 3.6–4.8)
Alkaline Phosphatase: 38 IU/L — ABNORMAL LOW (ref 39–117)
BUN/Creatinine Ratio: 16 (ref 10–24)
BUN: 24 mg/dL (ref 8–27)
Bilirubin Total: 0.2 mg/dL (ref 0.0–1.2)
CALCIUM: 9.9 mg/dL (ref 8.6–10.2)
CO2: 21 mmol/L (ref 20–29)
CREATININE: 1.49 mg/dL — AB (ref 0.76–1.27)
Chloride: 103 mmol/L (ref 96–106)
GFR calc Af Amer: 57 mL/min/{1.73_m2} — ABNORMAL LOW (ref >59)
GFR calc non Af Amer: 50 mL/min/{1.73_m2} — ABNORMAL LOW (ref >59)
GLUCOSE: 114 mg/dL — AB (ref 65–99)
Globulin, Total: 2.2 g/dL (ref 1.5–4.5)
Potassium: 4.3 mmol/L (ref 3.5–5.2)
Sodium: 140 mmol/L (ref 134–144)
Total Protein: 6.6 g/dL (ref 6.0–8.5)

## 2017-09-06 LAB — EPSTEIN-BARR VIRUS VCA ANTIBODY PANEL
EBV NA IGG: 104 U/mL — AB (ref 0.0–17.9)
EBV VCA IGG: 169 U/mL — AB (ref 0.0–17.9)

## 2017-09-08 DIAGNOSIS — E291 Testicular hypofunction: Secondary | ICD-10-CM | POA: Diagnosis not present

## 2017-09-09 DIAGNOSIS — M5030 Other cervical disc degeneration, unspecified cervical region: Secondary | ICD-10-CM | POA: Diagnosis not present

## 2017-09-09 DIAGNOSIS — Z79899 Other long term (current) drug therapy: Secondary | ICD-10-CM | POA: Diagnosis not present

## 2017-09-09 DIAGNOSIS — M542 Cervicalgia: Secondary | ICD-10-CM | POA: Diagnosis not present

## 2017-09-09 DIAGNOSIS — M5137 Other intervertebral disc degeneration, lumbosacral region: Secondary | ICD-10-CM | POA: Diagnosis not present

## 2017-09-09 DIAGNOSIS — M25551 Pain in right hip: Secondary | ICD-10-CM | POA: Diagnosis not present

## 2017-09-09 DIAGNOSIS — G8929 Other chronic pain: Secondary | ICD-10-CM | POA: Diagnosis not present

## 2017-09-25 ENCOUNTER — Other Ambulatory Visit: Payer: Self-pay | Admitting: Family Medicine

## 2017-09-30 ENCOUNTER — Encounter: Payer: Self-pay | Admitting: Family Medicine

## 2017-09-30 ENCOUNTER — Ambulatory Visit (INDEPENDENT_AMBULATORY_CARE_PROVIDER_SITE_OTHER): Payer: Medicare Other | Admitting: Family Medicine

## 2017-09-30 VITALS — BP 133/87 | HR 64 | Temp 97.6°F | Ht 70.0 in | Wt 234.4 lb

## 2017-09-30 DIAGNOSIS — J0110 Acute frontal sinusitis, unspecified: Secondary | ICD-10-CM

## 2017-09-30 DIAGNOSIS — Z125 Encounter for screening for malignant neoplasm of prostate: Secondary | ICD-10-CM

## 2017-09-30 MED ORDER — AMOXICILLIN-POT CLAVULANATE 875-125 MG PO TABS: 1.0000 | ORAL_TABLET | Freq: Two times a day (BID) | ORAL | 0 refills | Status: DC

## 2017-09-30 NOTE — Patient Instructions (Signed)

## 2017-09-30 NOTE — Progress Notes (Signed)
   HPI  Patient presents today with illness.  Patient reports 10 days of cough, congestion, chest congestion and sinus pressure.  Patient states that he is tolerating food and fluids like usual. He has mild shortness of breath. He has some wheezing and also fatigue.  Patient states that the carbuncle on his right hip has improved some but is still bothering him some.  PMH: Smoking status noted ROS: Per HPI  Objective: BP 133/87   Pulse 64   Temp 97.6 F (36.4 C) (Oral)   Ht 5\' 10"  (1.778 m)   Wt 234 lb 6.4 oz (106.3 kg)   SpO2 97%   BMI 33.63 kg/m  Gen: NAD, alert, cooperative with exam HEENT: NCAT, tenderness over bilateral frontal sinuses, oropharynx moist and clear CV: RRR, good S1/S2, no murmur Resp: CTABL, no wheezes, non-labored Ext: No edema, warm Neuro: Alert and oriented, No gross deficits Skin Right lateral upper buttock with approximately 1 cm subcutaneous nodule consistent with resolving cyst or abscess  Assessment and plan:  #Acute frontal sinusitis Treat with Augmentin Discuss usual course of illness  #Screening for prostate cancer-PSA drawn, previous was normal in December 2016 Discussion he will wait for his next blood draw before having PSA checked.  Laroy Apple, MD [facility] 09/30/2017, 1:07 PM

## 2017-10-01 ENCOUNTER — Other Ambulatory Visit: Payer: Self-pay | Admitting: Physician Assistant

## 2017-10-01 ENCOUNTER — Telehealth: Payer: Self-pay | Admitting: Family Medicine

## 2017-10-01 MED ORDER — ALBUTEROL SULFATE HFA 108 (90 BASE) MCG/ACT IN AERS: 2.0000 | INHALATION_SPRAY | Freq: Four times a day (QID) | RESPIRATORY_TRACT | 0 refills | Status: AC | PRN

## 2017-10-01 NOTE — Telephone Encounter (Signed)
Patient was seen yesterday by Dr. Wendi Snipes for cough, nasal congestion, chest congestion and sinus pressure x 10 days.  Was given Augmentin. States he is feeling worse today and would like a inhaler sent in for him. Covering PCP- please advise

## 2017-10-01 NOTE — Telephone Encounter (Signed)
Patient aware.

## 2017-10-01 NOTE — Progress Notes (Signed)
Albuterol

## 2017-10-01 NOTE — Telephone Encounter (Signed)
Sent albuterol to pharmacy, [facility]

## 2017-10-02 ENCOUNTER — Other Ambulatory Visit: Payer: Self-pay

## 2017-10-02 ENCOUNTER — Encounter (HOSPITAL_COMMUNITY): Payer: Self-pay | Admitting: *Deleted

## 2017-10-02 ENCOUNTER — Inpatient Hospital Stay (HOSPITAL_COMMUNITY)
Admission: EM | Admit: 2017-10-02 | Discharge: 2017-10-04 | DRG: 194 | Disposition: A | Discharge status: Home / Self Care | Payer: Medicare Other | Attending: Family Medicine | Admitting: Family Medicine

## 2017-10-02 ENCOUNTER — Telehealth: Payer: Self-pay | Admitting: Family Medicine

## 2017-10-02 ENCOUNTER — Emergency Department (HOSPITAL_COMMUNITY): Payer: Medicare Other

## 2017-10-02 DIAGNOSIS — Z8249 Family history of ischemic heart disease and other diseases of the circulatory system: Secondary | ICD-10-CM | POA: Diagnosis not present

## 2017-10-02 DIAGNOSIS — R0789 Other chest pain: Secondary | ICD-10-CM | POA: Diagnosis not present

## 2017-10-02 DIAGNOSIS — I129 Hypertensive chronic kidney disease with stage 1 through stage 4 chronic kidney disease, or unspecified chronic kidney disease: Secondary | ICD-10-CM | POA: Diagnosis not present

## 2017-10-02 DIAGNOSIS — R000 Tachycardia, unspecified: Secondary | ICD-10-CM | POA: Diagnosis present

## 2017-10-02 DIAGNOSIS — E785 Hyperlipidemia, unspecified: Secondary | ICD-10-CM | POA: Diagnosis not present

## 2017-10-02 DIAGNOSIS — Z833 Family history of diabetes mellitus: Secondary | ICD-10-CM | POA: Diagnosis not present

## 2017-10-02 DIAGNOSIS — R042 Hemoptysis: Secondary | ICD-10-CM | POA: Diagnosis not present

## 2017-10-02 DIAGNOSIS — Z9119 Patient's noncompliance with other medical treatment and regimen: Secondary | ICD-10-CM

## 2017-10-02 DIAGNOSIS — N183 Chronic kidney disease, stage 3 (moderate): Secondary | ICD-10-CM | POA: Diagnosis not present

## 2017-10-02 DIAGNOSIS — N189 Chronic kidney disease, unspecified: Secondary | ICD-10-CM | POA: Diagnosis not present

## 2017-10-02 DIAGNOSIS — E860 Dehydration: Secondary | ICD-10-CM | POA: Diagnosis present

## 2017-10-02 DIAGNOSIS — Z8052 Family history of malignant neoplasm of bladder: Secondary | ICD-10-CM | POA: Diagnosis not present

## 2017-10-02 DIAGNOSIS — R059 Cough, unspecified: Secondary | ICD-10-CM

## 2017-10-02 DIAGNOSIS — G4733 Obstructive sleep apnea (adult) (pediatric): Secondary | ICD-10-CM | POA: Diagnosis present

## 2017-10-02 DIAGNOSIS — Z8349 Family history of other endocrine, nutritional and metabolic diseases: Secondary | ICD-10-CM

## 2017-10-02 DIAGNOSIS — E1122 Type 2 diabetes mellitus with diabetic chronic kidney disease: Secondary | ICD-10-CM | POA: Diagnosis present

## 2017-10-02 DIAGNOSIS — R0602 Shortness of breath: Secondary | ICD-10-CM | POA: Diagnosis not present

## 2017-10-02 DIAGNOSIS — I10 Essential (primary) hypertension: Secondary | ICD-10-CM | POA: Diagnosis not present

## 2017-10-02 DIAGNOSIS — Z8261 Family history of arthritis: Secondary | ICD-10-CM | POA: Diagnosis not present

## 2017-10-02 DIAGNOSIS — F319 Bipolar disorder, unspecified: Secondary | ICD-10-CM | POA: Diagnosis present

## 2017-10-02 DIAGNOSIS — R05 Cough: Secondary | ICD-10-CM

## 2017-10-02 DIAGNOSIS — R634 Abnormal weight loss: Secondary | ICD-10-CM | POA: Diagnosis present

## 2017-10-02 DIAGNOSIS — R110 Nausea: Secondary | ICD-10-CM | POA: Diagnosis not present

## 2017-10-02 DIAGNOSIS — J181 Lobar pneumonia, unspecified organism: Principal | ICD-10-CM | POA: Diagnosis present

## 2017-10-02 DIAGNOSIS — E7849 Other hyperlipidemia: Secondary | ICD-10-CM

## 2017-10-02 DIAGNOSIS — N179 Acute kidney failure, unspecified: Secondary | ICD-10-CM | POA: Diagnosis not present

## 2017-10-02 DIAGNOSIS — J189 Pneumonia, unspecified organism: Secondary | ICD-10-CM | POA: Diagnosis not present

## 2017-10-02 DIAGNOSIS — R079 Chest pain, unspecified: Secondary | ICD-10-CM | POA: Diagnosis not present

## 2017-10-02 DIAGNOSIS — F329 Major depressive disorder, single episode, unspecified: Secondary | ICD-10-CM | POA: Diagnosis present

## 2017-10-02 HISTORY — DX: Dorsalgia, unspecified: M54.9

## 2017-10-02 HISTORY — DX: Cervicalgia: M54.2

## 2017-10-02 LAB — RAPID URINE DRUG SCREEN, HOSP PERFORMED
Amphetamines: NOT DETECTED
BARBITURATES: NOT DETECTED
Benzodiazepines: NOT DETECTED
COCAINE: NOT DETECTED
Opiates: POSITIVE — AB
TETRAHYDROCANNABINOL: POSITIVE — AB

## 2017-10-02 LAB — URINALYSIS, ROUTINE W REFLEX MICROSCOPIC
Bilirubin Urine: NEGATIVE
Glucose, UA: NEGATIVE mg/dL
Hgb urine dipstick: NEGATIVE
Ketones, ur: 5 mg/dL — AB
LEUKOCYTES UA: NEGATIVE
Nitrite: NEGATIVE
PROTEIN: NEGATIVE mg/dL
SPECIFIC GRAVITY, URINE: 1.019 (ref 1.005–1.030)
pH: 5 (ref 5.0–8.0)

## 2017-10-02 LAB — CBC WITH DIFFERENTIAL/PLATELET
BASOS PCT: 0 %
Basophils Absolute: 0 10*3/uL (ref 0.0–0.1)
EOS ABS: 0 10*3/uL (ref 0.0–0.7)
EOS PCT: 0 %
HCT: 36.9 % — ABNORMAL LOW (ref 39.0–52.0)
Hemoglobin: 12.4 g/dL — ABNORMAL LOW (ref 13.0–17.0)
LYMPHS ABS: 1.1 10*3/uL (ref 0.7–4.0)
Lymphocytes Relative: 7 %
MCH: 28.3 pg (ref 26.0–34.0)
MCHC: 33.6 g/dL (ref 30.0–36.0)
MCV: 84.2 fL (ref 78.0–100.0)
Monocytes Absolute: 1.4 10*3/uL — ABNORMAL HIGH (ref 0.1–1.0)
Monocytes Relative: 10 %
NEUTROS PCT: 83 %
Neutro Abs: 11.9 10*3/uL — ABNORMAL HIGH (ref 1.7–7.7)
PLATELETS: 256 10*3/uL (ref 150–400)
RBC: 4.38 MIL/uL (ref 4.22–5.81)
RDW: 12.1 % (ref 11.5–15.5)
WBC: 14.4 10*3/uL — AB (ref 4.0–10.5)

## 2017-10-02 LAB — COMPREHENSIVE METABOLIC PANEL
ALK PHOS: 33 U/L — AB (ref 38–126)
ALT: 11 U/L — AB (ref 17–63)
AST: 18 U/L (ref 15–41)
Albumin: 3.5 g/dL (ref 3.5–5.0)
Anion gap: 15 (ref 5–15)
BILIRUBIN TOTAL: 1.4 mg/dL — AB (ref 0.3–1.2)
BUN: 26 mg/dL — AB (ref 6–20)
CALCIUM: 9 mg/dL (ref 8.9–10.3)
CO2: 20 mmol/L — ABNORMAL LOW (ref 22–32)
CREATININE: 1.82 mg/dL — AB (ref 0.61–1.24)
Chloride: 98 mmol/L — ABNORMAL LOW (ref 101–111)
GFR, EST AFRICAN AMERICAN: 44 mL/min — AB (ref >60)
GFR, EST NON AFRICAN AMERICAN: 38 mL/min — AB (ref >60)
Glucose, Bld: 112 mg/dL — ABNORMAL HIGH (ref 65–99)
Potassium: 3.5 mmol/L (ref 3.5–5.1)
Sodium: 133 mmol/L — ABNORMAL LOW (ref 135–145)
TOTAL PROTEIN: 7.4 g/dL (ref 6.5–8.1)

## 2017-10-02 LAB — I-STAT TROPONIN, ED: TROPONIN I, POC: 0.04 ng/mL (ref 0.00–0.08)

## 2017-10-02 LAB — INFLUENZA PANEL BY PCR (TYPE A & B)
INFLAPCR: NEGATIVE
Influenza B By PCR: NEGATIVE

## 2017-10-02 LAB — I-STAT CG4 LACTIC ACID, ED: Lactic Acid, Venous: 1.16 mmol/L (ref 0.5–1.9)

## 2017-10-02 MED ORDER — ALBUTEROL SULFATE (2.5 MG/3ML) 0.083% IN NEBU
3.0000 mL | INHALATION_SOLUTION | Freq: Four times a day (QID) | RESPIRATORY_TRACT | Status: DC | PRN
  Filled 2017-10-02: qty 3

## 2017-10-02 MED ORDER — SODIUM CHLORIDE 0.9 % IV SOLN
500.0000 mg | Freq: Once | INTRAVENOUS | Status: AC
  Administered 2017-10-02: 500 mg via INTRAVENOUS
  Filled 2017-10-02: qty 500

## 2017-10-02 MED ORDER — ATORVASTATIN CALCIUM 10 MG PO TABS
10.0000 mg | ORAL_TABLET | Freq: Every day | ORAL | Status: DC
  Administered 2017-10-02 – 2017-10-03 (×2): 10 mg via ORAL
  Filled 2017-10-02 (×2): qty 1

## 2017-10-02 MED ORDER — FENOFIBRATE 54 MG PO TABS
54.0000 mg | ORAL_TABLET | Freq: Every day | ORAL | Status: DC
  Administered 2017-10-03 – 2017-10-04 (×2): 54 mg via ORAL
  Filled 2017-10-02 (×4): qty 1

## 2017-10-02 MED ORDER — LORATADINE 10 MG PO TABS
10.0000 mg | ORAL_TABLET | Freq: Every day | ORAL | Status: DC
  Administered 2017-10-03 – 2017-10-04 (×2): 10 mg via ORAL
  Filled 2017-10-02 (×2): qty 1

## 2017-10-02 MED ORDER — PANTOPRAZOLE SODIUM 40 MG PO TBEC
40.0000 mg | DELAYED_RELEASE_TABLET | Freq: Every day | ORAL | Status: DC
  Administered 2017-10-02 – 2017-10-04 (×3): 40 mg via ORAL
  Filled 2017-10-02 (×3): qty 1

## 2017-10-02 MED ORDER — DEXTROMETHORPHAN POLISTIREX ER 30 MG/5ML PO SUER
30.0000 mg | Freq: Every evening | ORAL | Status: DC | PRN
Start: 2017-10-02 — End: 2017-10-04
  Administered 2017-10-02: 30 mg via ORAL
  Filled 2017-10-02: qty 5

## 2017-10-02 MED ORDER — DULOXETINE HCL 60 MG PO CPEP
60.0000 mg | ORAL_CAPSULE | Freq: Every day | ORAL | Status: DC
  Administered 2017-10-02 – 2017-10-03 (×2): 60 mg via ORAL
  Filled 2017-10-02 (×2): qty 1

## 2017-10-02 MED ORDER — LEVOCETIRIZINE DIHYDROCHLORIDE 5 MG PO TABS: 5.0000 mg | ORAL_TABLET | Freq: Every evening | ORAL | Status: DC

## 2017-10-02 MED ORDER — ENOXAPARIN SODIUM 40 MG/0.4ML ~~LOC~~ SOLN
40.0000 mg | SUBCUTANEOUS | Status: DC
  Administered 2017-10-02 – 2017-10-03 (×2): 40 mg via SUBCUTANEOUS
  Filled 2017-10-02 (×2): qty 0.4

## 2017-10-02 MED ORDER — ACETAMINOPHEN 325 MG PO TABS
650.0000 mg | ORAL_TABLET | Freq: Four times a day (QID) | ORAL | Status: DC | PRN
  Administered 2017-10-03: 650 mg via ORAL
  Filled 2017-10-02: qty 2

## 2017-10-02 MED ORDER — SODIUM CHLORIDE 0.9 % IV BOLUS
1000.0000 mL | Freq: Once | INTRAVENOUS | Status: AC
  Administered 2017-10-02: 1000 mL via INTRAVENOUS

## 2017-10-02 MED ORDER — SODIUM CHLORIDE 0.9 % IV SOLN
1.0000 g | Freq: Once | INTRAVENOUS | Status: AC
  Administered 2017-10-02: 1 g via INTRAVENOUS
  Filled 2017-10-02: qty 10

## 2017-10-02 MED ORDER — HYDROCODONE-ACETAMINOPHEN 5-325 MG PO TABS
1.0000 | ORAL_TABLET | Freq: Three times a day (TID) | ORAL | Status: DC | PRN
  Administered 2017-10-02 – 2017-10-03 (×2): 1 via ORAL
  Filled 2017-10-02 (×2): qty 1

## 2017-10-02 MED ORDER — SODIUM CHLORIDE 0.9 % IV SOLN
1.0000 g | INTRAVENOUS | Status: DC
  Administered 2017-10-03: 1 g via INTRAVENOUS
  Filled 2017-10-02 (×2): qty 10
  Filled 2017-10-02: qty 1

## 2017-10-02 MED ORDER — SODIUM CHLORIDE 0.9 % IV SOLN
500.0000 mg | INTRAVENOUS | Status: DC
  Administered 2017-10-03: 500 mg via INTRAVENOUS
  Filled 2017-10-02 (×3): qty 500

## 2017-10-02 MED ORDER — TRAZODONE HCL 50 MG PO TABS
300.0000 mg | ORAL_TABLET | Freq: Every day | ORAL | Status: DC
  Administered 2017-10-02 – 2017-10-03 (×2): 300 mg via ORAL
  Filled 2017-10-02 (×2): qty 6

## 2017-10-02 MED ORDER — SODIUM CHLORIDE 0.9 % IV SOLN
INTRAVENOUS | Status: DC
  Administered 2017-10-02 – 2017-10-04 (×3): via INTRAVENOUS

## 2017-10-02 MED ORDER — MONTELUKAST SODIUM 10 MG PO TABS
10.0000 mg | ORAL_TABLET | Freq: Every day | ORAL | Status: DC
  Administered 2017-10-02 – 2017-10-03 (×2): 10 mg via ORAL
  Filled 2017-10-02 (×2): qty 1

## 2017-10-02 MED ORDER — FLUTICASONE PROPIONATE 50 MCG/ACT NA SUSP
2.0000 | Freq: Every day | NASAL | Status: DC
  Administered 2017-10-03: 2 via NASAL
  Filled 2017-10-02: qty 16

## 2017-10-02 MED ORDER — METOPROLOL TARTRATE 50 MG PO TABS
50.0000 mg | ORAL_TABLET | Freq: Two times a day (BID) | ORAL | Status: DC
  Administered 2017-10-02 – 2017-10-04 (×4): 50 mg via ORAL
  Filled 2017-10-02 (×4): qty 1

## 2017-10-02 NOTE — ED Provider Notes (Signed)
Emergency Department Provider Note   I have reviewed the triage vital signs and the nursing notes.   HISTORY  Chief Complaint Cough and Chest Pain   HPI David Simon is a 62 y.o. male with PMH of HTN, HLD, and anxiety resents to the emergency department for evaluation of productive, bloody cough for the last 2 weeks.  He has had associated right-sided burning chest pain with night sweats difficulty breathing, and mild sore throat.  Denies any body aches.  Also notes 3-4 months of unintentional weight loss.  Patient has had some decreased appetite but denies nausea, vomiting, diarrhea.  He saw his primary care provider 2 days ago and was started on Augmentin but has not noticed significant relief in symptoms.  Denies any recent travel or known sick contacts.  No radiation of symptoms.  Shortness of breath is worse with ambulation.  Patient's chest pain is right-sided and burning in quality and he states started after a coughing episode.    Past Medical History:  Diagnosis Date  . Allergy   . Anxiety    anti depressants help   . Arthritis   . Asthma    as a child   . Back pain   . Depression   . Essential hypertension   . Fracture of neck ([place])    Traumatic event 2008  . History of diabetes mellitus    as a child - no longer has   . Hypercholesterolemia   . Neck pain   . Seasonal allergies   . Sleep apnea   . Spinal stenosis    herniated disc, pinched nerve  --- pain clinic and [facility])    Patient Active Problem List   Diagnosis Date Noted  . CAP (community acquired pneumonia) 10/02/2017  . Acute kidney injury superimposed on chronic kidney disease ([place]) 10/02/2017  . Hypertriglyceridemia 04/25/2017  . OSA (obstructive sleep apnea) 08/07/2016  . Obesity 08/07/2016  . Asthma 07/12/2016  . MDD (major depressive disorder) 10/05/2015  . Spinal stenosis of lumbar region 08/29/2015  . Insomnia 08/29/2015  . Marijuana use 08/29/2015  . Encounter for screening  fecal occult blood testing 06/08/2015  . BPH (benign prostatic hyperplasia) 05/02/2015  . Erectile dysfunction 05/02/2015  . HTN (hypertension) 05/02/2015  . HLD (hyperlipidemia) 05/02/2015    Past Surgical History:  Procedure Laterality Date  . CARPAL TUNNEL RELEASE Bilateral   . NASAL SINUS SURGERY     x2   . TONSILLECTOMY        Allergies Proventil [albuterol]  Family History  Problem Relation Age of Onset  . Hypertension Mother   . High Cholesterol Mother   . Arthritis Mother   . Cancer Mother        bladder   . Other Mother        oxygen use   . Heart disease Father   . Heart attack Father   . Hyperlipidemia Sister   . Hypertension Sister   . Hypertension Brother   . Hyperlipidemia Brother   . Arthritis Brother   . Diabetes Maternal Grandmother   . Heart attack Maternal Grandfather   . Tuberculosis Paternal Grandmother        and other breathing problems  . Heart attack Paternal Grandfather     Social History Social History   Tobacco Use  . Smoking status: Current Some Day Smoker    Packs/day: 0.00    Types: Cigars  . Smokeless tobacco: Never Used  . Tobacco comment: smokes a  cigar maybe once a month  Substance Use Topics  . Alcohol use: Yes    Alcohol/week: 0.0 oz    Comment: very seldom  . Drug use: No    Review of Systems  Constitutional: No fever/chills. Positive unintentional weight loss and fatigue.  Eyes: No visual changes. ENT: No sore throat. Cardiovascular: Positive chest pain. Respiratory: Positive shortness of breath and cough.  Gastrointestinal: No abdominal pain. Positive nausea, no vomiting.  No diarrhea.  No constipation. Genitourinary: Negative for dysuria. Musculoskeletal: Negative for back pain. Skin: Negative for rash. Neurological: Negative for headaches, focal weakness or numbness.  10-point ROS otherwise negative.  ____________________________________________   PHYSICAL EXAM:  VITAL SIGNS: ED Triage Vitals    Enc Vitals Group     BP 10/02/17 1156 129/72     Pulse Rate 10/02/17 1156 (!) 127     Resp 10/02/17 1156 15     Temp 10/02/17 1156 (!) 94 F (34.4 C)     Temp Source 10/02/17 1156 Oral     SpO2 10/02/17 1156 95 %     Weight 10/02/17 1157 234 lb (106.1 kg)     Pain Score 10/02/17 1158 10   Constitutional: Alert and oriented. Patient providing a full history but appears diaphoretic.  Eyes: Conjunctivae are normal.  Head: Atraumatic. Nose: No congestion/rhinnorhea. Mouth/Throat: Mucous membranes are moist. Neck: No stridor.   Cardiovascular: Tachycardia. Good peripheral circulation. Grossly normal heart sounds.   Respiratory: Normal respiratory effort.  No retractions. Lungs CTAB. Gastrointestinal: Soft and nontender. No distention.  Musculoskeletal: No lower extremity tenderness nor edema. No gross deformities of extremities. Neurologic:  Normal speech and language. No gross focal neurologic deficits are appreciated.  Skin:  Skin is warm, dry and intact. No rash noted.  ____________________________________________   LABS (all labs ordered are listed, but only abnormal results are displayed)  Labs Reviewed  COMPREHENSIVE METABOLIC PANEL - Abnormal; Notable for the following components:      Result Value   Sodium 133 (*)    Chloride 98 (*)    CO2 20 (*)    Glucose, Bld 112 (*)    BUN 26 (*)    Creatinine, Ser 1.82 (*)    ALT 11 (*)    Alkaline Phosphatase 33 (*)    Total Bilirubin 1.4 (*)    GFR calc non Af Amer 38 (*)    GFR calc Af Amer 44 (*)    All other components within normal limits  CBC WITH DIFFERENTIAL/PLATELET - Abnormal; Notable for the following components:   WBC 14.4 (*)    Hemoglobin 12.4 (*)    HCT 36.9 (*)    Neutro Abs 11.9 (*)    Monocytes Absolute 1.4 (*)    All other components within normal limits  URINALYSIS, ROUTINE W REFLEX MICROSCOPIC - Abnormal; Notable for the following components:   APPearance HAZY (*)    Ketones, ur 5 (*)    All other  components within normal limits  CULTURE, BLOOD (ROUTINE X 2)  CULTURE, BLOOD (ROUTINE X 2)  CULTURE, EXPECTORATED SPUTUM-ASSESSMENT  GRAM STAIN  INFLUENZA PANEL BY PCR (TYPE A & B)  HIV ANTIBODY (ROUTINE TESTING)  STREP PNEUMONIAE URINARY ANTIGEN  BASIC METABOLIC PANEL  CBC WITH DIFFERENTIAL/PLATELET  RAPID URINE DRUG SCREEN, HOSP PERFORMED  I-STAT CG4 LACTIC ACID, ED  I-STAT TROPONIN, ED   ____________________________________________  EKG   EKG Interpretation  Date/Time:  Thursday October 02 2017 12:23:57 EDT Ventricular Rate:  113 PR Interval:    QRS  Duration: 93 QT Interval:  343 QTC Calculation: 471 R Axis:   63 Text Interpretation:  Sinus tachycardia Nonspecific T abnormalities, diffuse leads No STEMI.  Confirmed by Nanda Quinton [id number]) on 10/02/2017 3:05:08 PM       ____________________________________________  RADIOLOGY  Dg Chest Port 1 View  Result Date: 10/02/2017 CLINICAL DATA:  Productive cough.  Chest pain. EXAM: PORTABLE CHEST 1 VIEW COMPARISON:  07/12/2016. FINDINGS: Mediastinum and hilar structures normal. Cardiomegaly with normal pulmonary vascularity. Right base atelectasis/infiltrate. No pleural effusion or pneumothorax. IMPRESSION: 1.  Right base atelectasis/infiltrate. 2.  Cardiomegaly with normal pulmonary vascularity. Electronically Signed   By: Marcello Moores  Register   On: 10/02/2017 14:28    ____________________________________________   PROCEDURES  Procedure(s) performed:   .Critical Care  Performed by: Margette Fast, MD  Authorized by: Margette Fast, MD   Critical care provider statement:    Critical care time (minutes):  35   Critical care time was exclusive of:  Separately billable procedures and treating other patients and teaching time   Critical care was necessary to treat or prevent imminent or life-threatening deterioration of the following conditions:  Respiratory failure and sepsis   Critical care was time spent personally by me on  the following activities:  Blood draw for specimens, development of treatment plan with patient or surrogate, evaluation of patient's response to treatment, examination of patient, obtaining history from patient or surrogate, ordering and performing treatments and interventions, ordering and review of laboratory studies, ordering and review of radiographic studies, pulse oximetry, re-evaluation of patient's condition and review of old charts   I assumed direction of critical care for this patient from another provider in my specialty: no       ____________________________________________   INITIAL IMPRESSION / [place] / ED COURSE  Pertinent labs & imaging results that were available during my care of the patient were reviewed by me and considered in my medical decision making (see chart for details).  Patient presents to the emergency department for evaluation of productive cough for the past 2 weeks with atypical chest pain, night sweats, difficulty breathing.  He notes some hemoptysis as well.  Patient with no significant risk factors for TB.  He has infiltrate on chest x-ray.  Patient is hypothermic, tachycardic, tachypneic, and has a borderline oxygen saturation.  I have ordered labs covering for sepsis and will start broad-spectrum antibiotics.  We will also send flu testing.  Labs and imaging reviewed. Suspect PNA with continued tachycardia and fever. BP is holding but patient remains tachypneic. Plan for continued IVF, abx, and admission with some confusion noted by wife.   Discussed patient's case with Hospitalist, Dr. Lorin Mercy to request admission. Patient and family (if present) updated with plan. Care transferred to Hospitalist service.  I reviewed all nursing notes, vitals, pertinent old records, EKGs, labs, imaging (as available).   ____________________________________________  FINAL CLINICAL IMPRESSION(S) / ED DIAGNOSES  Final diagnoses:  Community acquired  pneumonia of right lower lobe of lung ([place])     MEDICATIONS GIVEN DURING THIS VISIT:  Medications  cefTRIAXone (ROCEPHIN) 1 g in sodium chloride 0.9 % 100 mL IVPB (has no administration in time range)  azithromycin (ZITHROMAX) 500 mg in sodium chloride 0.9 % 250 mL IVPB (has no administration in time range)  albuterol (PROVENTIL) (2.5 MG/3ML) 0.083% nebulizer solution 3 mL (has no administration in time range)  atorvastatin (LIPITOR) tablet 10 mg (10 mg Oral Given 10/02/17 2134)  DULoxetine (CYMBALTA) DR capsule 60 mg (  60 mg Oral Given 10/02/17 2135)  fenofibrate tablet 54 mg (has no administration in time range)  fluticasone (FLONASE) 50 MCG/ACT nasal spray 2 spray (has no administration in time range)  HYDROcodone-acetaminophen (NORCO/VICODIN) 5-325 MG per tablet 1 tablet (1 tablet Oral Given 10/02/17 2135)  enoxaparin (LOVENOX) injection 40 mg (40 mg Subcutaneous Given 10/02/17 2138)  0.9 %  sodium chloride infusion ( Intravenous New Bag/Given 10/02/17 2133)  dextromethorphan (DELSYM) 30 MG/5ML liquid 30 mg (30 mg Oral Given 10/02/17 2138)  loratadine (CLARITIN) tablet 10 mg (has no administration in time range)  traZODone (DESYREL) tablet 300 mg (300 mg Oral Given 10/02/17 2133)  metoprolol tartrate (LOPRESSOR) tablet 50 mg (50 mg Oral Given by Other 10/02/17 2135)  montelukast (SINGULAIR) tablet 10 mg (10 mg Oral Given by Other 10/02/17 2136)  pantoprazole (PROTONIX) EC tablet 40 mg (40 mg Oral Given 10/02/17 2136)  acetaminophen (TYLENOL) tablet 650 mg (has no administration in time range)  cefTRIAXone (ROCEPHIN) 1 g in sodium chloride 0.9 % 100 mL IVPB (0 g Intravenous Stopped 10/02/17 1626)  azithromycin (ZITHROMAX) 500 mg in sodium chloride 0.9 % 250 mL IVPB (0 mg Intravenous Stopped 10/02/17 1819)  sodium chloride 0.9 % bolus 1,000 mL (0 mLs Intravenous Stopped 10/02/17 1711)    Note:  This document was prepared using Dragon voice recognition software and may include unintentional dictation  errors.  Nanda Quinton, MD Emergency Medicine   Long, W[facility], MD 10/02/17 [phone]

## 2017-10-02 NOTE — ED Triage Notes (Addendum)
Pt c/o green and bloody productive cough x 2 weeks, chest pain, night sweats, difficulty breathing, runny nose x 2 days. Pt reports intentional recent weight loss of 24lbs in the last 3-4 months.

## 2017-10-02 NOTE — H&P (Signed)
History and Physical    David Simon DUK:[id number] DOB: Apr 18, 1956 DOA: 10/02/2017  PCP: Timmothy Euler, MD Consultants:  Pain management - [place]; [place]; Corrie Dandy - pulmonology Patient coming from:  Home - lives with housemate, her son and her daughter; Dannette Barbara, [phone]  Chief Complaint:  Cough, chest pain  HPI: David Simon is a 62 y.o. male with medical history significant of spinal stenosis; OSA; HLD; DM; and HTN presenting with respiratory symptoms.  He has been sick about 2 weeks.  He saw the doctor Tuesday and was given Augmentin for acute sinusitis.  He had a coughing fit and it caused a burning sensation in his chest with mild hemoptysis.  +subjective fevers.  He did not appear to improve at all on Augmentin.  +sick contacts but none close.  Cough is productive of green mucus (other than hemoptysis today).     ED Course:   Pneumonia, feeling bad for a few weeks.  Also with TB symptoms - cough, hemoptysis, weight loss; no significant risk factors.  Treated as sepsis with some improvement.  Winded after walking to the bathroom.  RLL infiltrate, so less likely TB.  Flu negative.  Review of Systems: As per HPI; otherwise review of systems reviewed and negative.   Ambulatory Status:  Ambulates without assistance  Past Medical History:  Diagnosis Date  . Allergy   . Anxiety    anti depressants help   . Arthritis   . Asthma    as a child   . Back pain   . Depression   . Essential hypertension   . Fracture of neck ([place])    Traumatic event 2008  . History of diabetes mellitus    as a child - no longer has   . Hypercholesterolemia   . Neck pain   . Seasonal allergies   . Sleep apnea   . Spinal stenosis    herniated disc, pinched nerve  --- pain clinic and [facility])    Past Surgical History:  Procedure Laterality Date  . CARPAL TUNNEL RELEASE Bilateral   . NASAL SINUS SURGERY     x2   . TONSILLECTOMY      Social History    Socioeconomic History  . Marital status: Divorced    Spouse name: Not on file  . Number of children: 1  . Years of education: Not on file  . Highest education level: Not on file  Occupational History  . Occupation: disability    Comment: union iron worker  - [employer]   Social Needs  . Financial resource strain: Not on file  . Food insecurity:    Worry: Not on file    Inability: Not on file  . Transportation needs:    Medical: Not on file    Non-medical: Not on file  Tobacco Use  . Smoking status: Current Some Day Smoker    Packs/day: 0.00    Types: Cigars  . Smokeless tobacco: Never Used  . Tobacco comment: smokes a cigar maybe once a month  Substance and Sexual Activity  . Alcohol use: Yes    Alcohol/week: 0.0 oz    Comment: very seldom  . Drug use: No  . Sexual activity: Yes  Lifestyle  . Physical activity:    Days per week: Not on file    Minutes per session: Not on file  . Stress: Not on file  Relationships  . Social connections:    Talks on phone: Not on file  Gets together: Not on file    Attends religious service: Not on file    Active member of club or organization: Not on file    Attends meetings of clubs or organizations: Not on file    Relationship status: Not on file  . Intimate partner violence:    Fear of current or ex partner: Not on file    Emotionally abused: Not on file    Physically abused: Not on file    Forced sexual activity: Not on file  Other Topics Concern  . Not on file  Social History Narrative  . Not on file    Allergies  Allergen Reactions  . Proventil [Albuterol]     Increased HR     Family History  Problem Relation Age of Onset  . Hypertension Mother   . High Cholesterol Mother   . Arthritis Mother   . Cancer Mother        bladder   . Other Mother        oxygen use   . Heart disease Father   . Heart attack Father   . Hyperlipidemia Sister   . Hypertension Sister   . Hypertension Brother   . Hyperlipidemia Brother    . Arthritis Brother   . Diabetes Maternal Grandmother   . Heart attack Maternal Grandfather   . Tuberculosis Paternal Grandmother        and other breathing problems  . Heart attack Paternal Grandfather     Prior to Admission medications   Medication Sig Start Date End Date Taking? Authorizing Provider  albuterol (PROVENTIL HFA;VENTOLIN HFA) 108 (90 Base) MCG/ACT inhaler Inhale 2 puffs into the lungs every 6 (six) hours as needed for wheezing or shortness of breath. 10/01/17  Yes Terald Sleeper, PA-C  amoxicillin-clavulanate (AUGMENTIN) 875-125 MG tablet Take 1 tablet by mouth 2 (two) times daily. 09/30/17  Yes Timmothy Euler, MD  atorvastatin (LIPITOR) 10 MG tablet TAKE 1 TABLET BY MOUTH ONCE DAILY 09/26/17  Yes Claretta Fraise, MD  DULoxetine (CYMBALTA) 60 MG capsule TAKE 1 CAPSULE BY MOUTH ONCE DAILY AT BEDTIME. 07/17/17  Yes Timmothy Euler, MD  fenofibrate 54 MG tablet TAKE 1 TABLET BY MOUTH ONCE DAILY 09/03/17  Yes Timmothy Euler, MD  fluticasone (FLONASE) 50 MCG/ACT nasal spray Place into both nostrils daily.   Yes [provider]  HYDROcodone-acetaminophen (NORCO/VICODIN) 5-325 MG tablet Take 1 tablet by mouth 3 (three) times daily as needed. Takes 1 a day usually 06/11/17  Yes [provider]  levocetirizine (XYZAL) 5 MG tablet TAKE 1 TABLET BY MOUTH ONCE DAILY IN THE EVENING 09/26/17  Yes Stacks, Cletus Gash, MD  lisinopril-hydrochlorothiazide (PRINZIDE,ZESTORETIC) 20-25 MG tablet TAKE 1 TABLET BY MOUTH ONCE DAILY 09/03/17  Yes Timmothy Euler, MD  metoprolol tartrate (LOPRESSOR) 50 MG tablet Take 1 tablet (50 mg total) by mouth 2 (two) times daily. 04/25/17  Yes Timmothy Euler, MD  montelukast (SINGULAIR) 10 MG tablet TAKE 1 TABLET BY MOUTH ONCE DAILY AT BEDTIME 09/26/17  Yes Stacks, Cletus Gash, MD  pantoprazole (PROTONIX) 40 MG tablet TAKE 1 TABLET BY MOUTH ONCE DAILY 08/14/17  Yes Claretta Fraise, MD  risperiDONE (RISPERDAL) 0.5 MG tablet TAKE 1 TABLET BY MOUTH ONCE  DAILY AT BEDTIME 12/24/16  Yes Timmothy Euler, MD  sildenafil (REVATIO) 20 MG tablet TAKE 2 TO 5 TABLETS BY MOUTH DAILY AS NEEDED FOR ERECTILE DYSFUNCTION. 06/01/17  Yes Timmothy Euler, MD  trazodone (DESYREL) 300 MG tablet TAKE 1 TABLET BY  MOUTH ONCE DAILY AT BEDTIME AS NEEDED FOR SLEEP 08/07/17  Yes Timmothy Euler, MD  risperiDONE (RISPERDAL) 0.5 MG tablet TAKE 1 TABLET BY MOUTH ONCE DAILY AT BEDTIME 08/14/17   Claretta Fraise, MD    Physical Exam: Vitals:   10/02/17 1745 10/02/17 1910 10/02/17 1936 10/02/17 2042  BP:    120/73  Pulse: (!) 101 (!) 103 (!) 103 (!) 102  Resp: (!) 23 15 (!) 22 18  Temp:    (!) 101.1 F (38.4 C)  TempSrc:    Oral  SpO2: 92% 94% 94% 93%  Weight:    105 kg (231 lb 7.7 oz)  Height:    5\' 10"  (1.778 m)     General:  Appears calm and comfortable and is NAD Eyes:  PERRL, EOMI, normal lids, iris ENT:  grossly normal hearing, lips & tongue, mmm Neck:  no LAD, masses or thyromegaly Cardiovascular:  RRR, no m/r/g. No LE edema.  Respiratory:   RLL rhonchi, otherwise CTA other than rare wheezes.  Normal respiratory effort.  On room air. Abdomen:  soft, NT, ND, NABS Back:   normal alignment, no CVAT Skin:  no rash or induration seen on limited exam Musculoskeletal:  grossly normal tone BUE/BLE, good ROM, no bony abnormality Psychiatric:  grossly normal mood and affect, speech fluent and appropriate, AOx3 Neurologic:  CN 2-12 grossly intact, moves all extremities in coordinated fashion, sensation intact    Radiological Exams on Admission: Dg Chest Port 1 View  Result Date: 10/02/2017 CLINICAL DATA:  Productive cough.  Chest pain. EXAM: PORTABLE CHEST 1 VIEW COMPARISON:  07/12/2016. FINDINGS: Mediastinum and hilar structures normal. Cardiomegaly with normal pulmonary vascularity. Right base atelectasis/infiltrate. No pleural effusion or pneumothorax. IMPRESSION: 1.  Right base atelectasis/infiltrate. 2.  Cardiomegaly with normal pulmonary vascularity.  Electronically Signed   By: Marcello Moores  Register   On: 10/02/2017 14:28    EKG: Independently reviewed.  Sinus tachycardia with rate 113; nonspecific ST changes with no evidence of acute ischemia, likely rate-related   Labs on Admission: I have personally reviewed the available labs and imaging studies at the time of the admission.  Pertinent labs:   CO2 20 Glucose 112 BUN 26/Creatinine 1.82/GFR 38; 24/1.49/50 on 09/05/17 Bilirubin 1.4, normal in 3/19 Troponin 0.04 Lactate 1.16 WBC 14.4 Hgb 12.4 Flu negative Blood cultures x2  Assessment/Plan Principal Problem:   CAP (community acquired pneumonia) Active Problems:   HTN (hypertension)   HLD (hyperlipidemia)   MDD (major depressive disorder)   OSA (obstructive sleep apnea)   Acute kidney injury superimposed on chronic kidney disease ([facility])   CAP -Given productive cough, fever, and infiltrate in right lower lobe on chest x-ray, most likely community-acquired pneumonia.  -Influenza negative. -CURB-65 score is 1 - will admit the patient to Med Surg. -Pneumonia Severity Index (PSI) is Class 2, <1% mortality. -Will start Azithromycin 500 mg daily AND Rocephin due to no risk factors for MDR cause. -NS @ 75cc/hr -Fever control -Repeat CBC in am -Sputum cultures -Blood cultures -Strep pneumo testing -Will order lower respiratory tract procalcitonin level.  Antibiotics would not be indicated for PCT <0.1 and probably should not be used for < 0.25.  >0.5 indicates infection and >>0.5 indicates more serious disease.  As the procalcitonin level normalizes, it will be reasonable to consider de-escalation of antibiotic coverage. -albuterol PRN -Delsym qhs prn cough -Hemoptysis is likely related to small vessel/capillary bleed and has not been significant; if this worsens it may require pulmonology consultation  -He did fail  outpatient antibiotic therapy with Augmentin  AKI on stage 3 CKD -Likely due to prerenal failure secondary to  dehydration in the setting of persistent infection and continuation of ACEI and HCTZ -IVF as above -Follow up renal function by BMP -Avoid ACEI, diuretics, and NSAIDs  HTN -Continue Lopressor -Hold lisinopril-HCTZ  HLD -Continue Lipitor  OSA -Not on CPAP despite recurrent counseling about the importance of use -Once again, I have counseled the patient  Depression -Continue home meds - Cymbalta, Trazodone  DVT prophylaxis: Lovenox  Code Status: Full - confirmed with patient/family Family Communication: Housemate present throughout evaluation Disposition Plan:  Home once clinically improved Consults called: None  Admission status: Admit - It is my clinical opinion that admission to INPATIENT is reasonable and necessary because of the expectation that this patient will require hospital care that crosses at least 2 midnights to treat this condition based on the medical complexity of the problems presented.  Given the aforementioned information, the predictability of an adverse outcome is felt to be significant.    Karmen Bongo MD [facility]  If note is complete, please contact covering daytime or nighttime physician. [email or web address] Password [email or web address]  10/02/2017, 9:11 PM

## 2017-10-02 NOTE — ED Notes (Signed)
Attempted to give report again, nurse states she will have to call back she is in room with a pt

## 2017-10-02 NOTE — Progress Notes (Signed)
MD paged d/t pt having fever of 101.1-no tylenol ordered. Norco given per pt request d/t pain. Will continue to monitor pt

## 2017-10-02 NOTE — ED Notes (Signed)
Per EDP airborne precautions can be discontinued, pt should be on droplet precautions now

## 2017-10-02 NOTE — Progress Notes (Signed)
Pharmacy Antibiotic Note  David Simon is a 62 y.o. male admitted on 10/02/2017 with CAP.  Pharmacy has been consulted for azithromycin and ceftriaxone dosing.  Plan: Azithromycin 500 mg IV q24 hours Ceftriaxone 1000 mg IV q24 hours Monitor labs, c/s, and patient improvement Switch to PO when appropriate  Weight: 234 lb (106.1 kg)  Temp (24hrs), Avg:94 F (34.4 C), Min:94 F (34.4 C), Max:94 F (34.4 C)  Recent Labs  Lab 10/02/17 1528 10/02/17 1543  WBC 14.4*  --   CREATININE 1.82*  --   LATICACIDVEN  --  1.16    Estimated Creatinine Clearance: 51.3 mL/min (A) (by C-G formula based on SCr of 1.82 mg/dL (H)).    Allergies  Allergen Reactions  . Proventil [Albuterol]     Increased HR     Antimicrobials this admission: Azithromycin 4/4 >>  Ceftriaxone 4/4 >>   Dose adjustments this admission: N/A  Microbiology results: 4/4 BCx: pending  Thank you for allowing pharmacy to be a part of this patient's care.  Margot Ables, PharmD Clinical Pharmacist 10/02/2017 4:17 PM

## 2017-10-02 NOTE — ED Notes (Signed)
Attempted to call report, dept 300 nurse states she will call back

## 2017-10-02 NOTE — Telephone Encounter (Signed)
lmtcb

## 2017-10-03 LAB — CBC WITH DIFFERENTIAL/PLATELET
BASOS ABS: 0 10*3/uL (ref 0.0–0.1)
BASOS PCT: 0 %
EOS ABS: 0 10*3/uL (ref 0.0–0.7)
Eosinophils Relative: 0 %
HCT: 34.2 % — ABNORMAL LOW (ref 39.0–52.0)
HEMOGLOBIN: 11 g/dL — AB (ref 13.0–17.0)
Lymphocytes Relative: 14 %
Lymphs Abs: 1.4 10*3/uL (ref 0.7–4.0)
MCH: 27.4 pg (ref 26.0–34.0)
MCHC: 32.2 g/dL (ref 30.0–36.0)
MCV: 85.1 fL (ref 78.0–100.0)
MONOS PCT: 9 %
Monocytes Absolute: 0.9 10*3/uL (ref 0.1–1.0)
Neutro Abs: 8.1 10*3/uL — ABNORMAL HIGH (ref 1.7–7.7)
Neutrophils Relative %: 77 %
Platelets: 265 10*3/uL (ref 150–400)
RBC: 4.02 MIL/uL — ABNORMAL LOW (ref 4.22–5.81)
RDW: 12.3 % (ref 11.5–15.5)
WBC: 10.4 10*3/uL (ref 4.0–10.5)

## 2017-10-03 LAB — BASIC METABOLIC PANEL
Anion gap: 13 (ref 5–15)
BUN: 24 mg/dL — ABNORMAL HIGH (ref 6–20)
CALCIUM: 8.7 mg/dL — AB (ref 8.9–10.3)
CO2: 24 mmol/L (ref 22–32)
CREATININE: 1.76 mg/dL — AB (ref 0.61–1.24)
Chloride: 101 mmol/L (ref 101–111)
GFR calc Af Amer: 46 mL/min — ABNORMAL LOW (ref >60)
GFR, EST NON AFRICAN AMERICAN: 40 mL/min — AB (ref >60)
Glucose, Bld: 101 mg/dL — ABNORMAL HIGH (ref 65–99)
Potassium: 3.6 mmol/L (ref 3.5–5.1)
SODIUM: 138 mmol/L (ref 135–145)

## 2017-10-03 LAB — STREP PNEUMONIAE URINARY ANTIGEN: STREP PNEUMO URINARY ANTIGEN: NEGATIVE

## 2017-10-03 MED ORDER — LEVALBUTEROL HCL 0.63 MG/3ML IN NEBU
0.6300 mg | INHALATION_SOLUTION | RESPIRATORY_TRACT | Status: AC
  Administered 2017-10-03: 0.63 mg via RESPIRATORY_TRACT
  Filled 2017-10-03: qty 3

## 2017-10-03 MED ORDER — GUAIFENESIN ER 600 MG PO TB12
1200.0000 mg | ORAL_TABLET | Freq: Two times a day (BID) | ORAL | Status: DC
  Administered 2017-10-03 – 2017-10-04 (×2): 1200 mg via ORAL
  Filled 2017-10-03 (×2): qty 2

## 2017-10-03 MED ORDER — LEVALBUTEROL HCL 0.63 MG/3ML IN NEBU: 0.6300 mg | INHALATION_SOLUTION | Freq: Four times a day (QID) | RESPIRATORY_TRACT | Status: DC | PRN

## 2017-10-03 NOTE — Care Management Important Message (Signed)
Important Message  Patient Details  Name: David Simon MRN: [id number] Date of Birth: Mar 23, 1956   Medicare Important Message Given:  Yes    Shelda Altes 10/03/2017, 10:13 AM

## 2017-10-03 NOTE — Progress Notes (Signed)
Pt stated he wants tylenol because he has a fever-temp checked 99.6. Tylenol given per pt request. Will continue to monitor

## 2017-10-03 NOTE — Progress Notes (Signed)
The patient called for a breathing treatment. When I arrived he appeared in no respiratory distress, he stated he was in no distress and he had a room air SpO2 of 94%. He declined the PRN Q6 Albuterol nebulizer treatment at this time stating the last time he used it , it made his heart race and gave him an anxiety attack. The patient said the doctor was going to order an alternate nebulizer medication. Once the order is received the treatment will be administered.

## 2017-10-03 NOTE — Progress Notes (Signed)
PROGRESS NOTE    David Simon  OVZ:[id number] DOB: [birth year] DOA: 10/02/2017 PCP: Timmothy Euler, MD    Brief Narrative:  62 year old male with a history of diabetes, hypertension, hyperlipidemia, presented with cough and chest pain.  Found to have community-acquired pneumonia.  He had recently completed a course of Augmentin as an outpatient.  Patient was noted to be febrile on arrival.  Started on IV antibiotics.  Overall fevers are improving.  Still somewhat short of breath with cough.  Continue IV antibiotics for last 24 hours.  If cultures are negative and remains afebrile, anticipate transitioning to oral antibiotics in a.m.  Probable discharge in next 24 hours.   Assessment & Plan:   Principal Problem:   CAP (community acquired pneumonia) Active Problems:   HTN (hypertension)   HLD (hyperlipidemia)   MDD (major depressive disorder)   OSA (obstructive sleep apnea)   Acute kidney injury superimposed on chronic kidney disease ([place])   1. Community-acquired pneumonia.  Presented with cough, fever, chest x-ray findings consistent with pneumonia.  Influenza negative.  Started on intravenous antibiotics.  Fevers appear to be resolving.  Blood culture sent and are currently in process.  Overall WBC count is normalized.  He is still short of breath on exertion and has rhonchi with wheezes.  Start the patient on bronchodilators.  Continue on IV antibiotics for today.  If remains afebrile with negative blood cultures and clinically improving, anticipate transition to oral antibiotics in a.m. 2. AK I on CKD stage III.  Mild improvement of creatinine with hydration.  Continue another 24 hours of IV fluids and recheck labs in a.m.  Lisinopril/hydrochlorothiazide currently on hold 3. Hypertension.  Continue Lopressor.  Lisinopril/hydrochlorothiazide currently on hold 4. Hyperlipidemia.  Continue statin 5. Obstructive sleep apnea.  Noncompliant with CPAP. 6. Depression.  Continue on Cymbalta and  trazodone   DVT prophylaxis: lovenox Code Status: full code Family Communication: discussed with wife at the bedside Disposition Plan: discharge home possibly in AM, if continues to improve   Consultants:     Procedures:     Antimicrobials:   Ceftriaxone 4/4>  Azithromycin 4/4>    Subjective: Patient is feeling better.  Continues to have cough, wheezing.  Still feels tired.  Objective: Vitals:   10/02/17 2042 10/03/17 0135 10/03/17 0508 10/03/17 0510  BP: 120/73  (!) 126/59   Pulse: (!) 102  85 85  Resp: 18  20   Temp: (!) 101.1 F (38.4 C) 99.6 F (37.6 C)  99 F (37.2 C)  TempSrc: Oral Oral  Oral  SpO2: 93%  93% 93%  Weight: 105 kg (231 lb 7.7 oz)     Height: 5\' 10"  (1.778 m)       Intake/Output Summary (Last 24 hours) at 10/03/2017 1828 Last data filed at 10/03/2017 1745 Gross per 24 hour  Intake 2465 ml  Output 200 ml  Net 2265 ml   Filed Weights   10/02/17 1157 10/02/17 2042  Weight: 106.1 kg (234 lb) 105 kg (231 lb 7.7 oz)    Examination:  General exam: Appears calm and comfortable, mildly diaphoretic Respiratory system: Bilateral rhonchi mild wheeze. Respiratory effort normal. Cardiovascular system: S1 & S2 heard, RRR. No JVD, murmurs, rubs, gallops or clicks. No pedal edema. Gastrointestinal system: Abdomen is nondistended, soft and nontender. No organomegaly or masses felt. Normal bowel sounds heard. Central nervous system: Alert and oriented. No focal neurological deficits. Extremities: Symmetric 5 x 5 power. Skin: Appears flushed Psychiatry: Judgement and insight appear normal. Mood &  affect appropriate.     Data Reviewed: I have personally reviewed following labs and imaging studies  CBC: Recent Labs  Lab 10/02/17 1528 10/03/17 0609  WBC 14.4* 10.4  NEUTROABS 11.9* 8.1*  HGB 12.4* 11.0*  HCT 36.9* 34.2*  MCV 84.2 85.1  PLT 256 161   Basic Metabolic Panel: Recent Labs  Lab 10/02/17 1528 10/03/17 0609  NA 133* 138  K 3.5  3.6  CL 98* 101  CO2 20* 24  GLUCOSE 112* 101*  BUN 26* 24*  CREATININE 1.82* 1.76*  CALCIUM 9.0 8.7*   GFR: Estimated Creatinine Clearance: 52.8 mL/min (A) (by C-G formula based on SCr of 1.76 mg/dL (H)). Liver Function Tests: Recent Labs  Lab 10/02/17 1528  AST 18  ALT 11*  ALKPHOS 33*  BILITOT 1.4*  PROT 7.4  ALBUMIN 3.5   No results for input(s): LIPASE, AMYLASE in the last 168 hours. No results for input(s): AMMONIA in the last 168 hours. Coagulation Profile: No results for input(s): INR, PROTIME in the last 168 hours. Cardiac Enzymes: No results for input(s): CKTOTAL, CKMB, CKMBINDEX, TROPONINI in the last 168 hours. BNP (last 3 results) No results for input(s): PROBNP in the last 8760 hours. HbA1C: No results for input(s): HGBA1C in the last 72 hours. CBG: No results for input(s): GLUCAP in the last 168 hours. Lipid Profile: No results for input(s): CHOL, HDL, LDLCALC, TRIG, CHOLHDL, LDLDIRECT in the last 72 hours. Thyroid Function Tests: No results for input(s): TSH, T4TOTAL, FREET4, T3FREE, THYROIDAB in the last 72 hours. Anemia Panel: No results for input(s): VITAMINB12, FOLATE, FERRITIN, TIBC, IRON, RETICCTPCT in the last 72 hours. Sepsis Labs: Recent Labs  Lab 10/02/17 1543  LATICACIDVEN 1.16    Recent Results (from the past 240 hour(s))  Blood Culture (routine x 2)     Status: None (Preliminary result)   Collection Time: 10/02/17  3:28 PM  Result Value Ref Range Status   Specimen Description LEFT ANTECUBITAL  Final   Special Requests   Final    BOTTLES DRAWN AEROBIC AND ANAEROBIC Blood Culture adequate volume   Culture   Final    NO GROWTH < 24 HOURS Performed at [facility], [address], [place], [place] [address]    Report Status PENDING  Incomplete  Blood Culture (routine x 2)     Status: None (Preliminary result)   Collection Time: 10/02/17  3:29 PM  Result Value Ref Range Status   Specimen Description LEFT ANTECUBITAL  Final   Special  Requests   Final    BOTTLES DRAWN AEROBIC AND ANAEROBIC Blood Culture adequate volume   Culture   Final    NO GROWTH < 24 HOURS Performed at [facility], [address], [place], [place] [address]    Report Status PENDING  Incomplete         Radiology Studies: Dg Chest Port 1 View  Result Date: 10/02/2017 CLINICAL DATA:  Productive cough.  Chest pain. EXAM: PORTABLE CHEST 1 VIEW COMPARISON:  07/12/2016. FINDINGS: Mediastinum and hilar structures normal. Cardiomegaly with normal pulmonary vascularity. Right base atelectasis/infiltrate. No pleural effusion or pneumothorax. IMPRESSION: 1.  Right base atelectasis/infiltrate. 2.  Cardiomegaly with normal pulmonary vascularity. Electronically Signed   By: Marcello Moores  Register   On: 10/02/2017 14:28        Scheduled Meds: . atorvastatin  10 mg Oral q1800  . DULoxetine  60 mg Oral QHS  . enoxaparin (LOVENOX) injection  40 mg Subcutaneous Q24H  . fenofibrate  54 mg Oral  Daily  . fluticasone  2 spray Each Nare Daily  . guaiFENesin  1,200 mg Oral BID  . levalbuterol  0.63 mg Nebulization NOW  . loratadine  10 mg Oral Daily  . metoprolol tartrate  50 mg Oral BID  . montelukast  10 mg Oral QHS  . pantoprazole  40 mg Oral Daily  . traZODone  300 mg Oral QHS   Continuous Infusions: . sodium chloride 75 mL/hr at 10/03/17 1100  . azithromycin 500 mg (10/03/17 1702)  . cefTRIAXone (ROCEPHIN)  IV Stopped (10/03/17 1629)     LOS: 1 day    Kathie Dike, MD [facility] Pager [phone]  If 7PM-7AM, please contact night-coverage [email or web address] Password [email or web address] 10/03/2017, 6:28 PM

## 2017-10-04 LAB — BASIC METABOLIC PANEL
Anion gap: 11 (ref 5–15)
BUN: 21 mg/dL — ABNORMAL HIGH (ref 6–20)
CHLORIDE: 105 mmol/L (ref 101–111)
CO2: 22 mmol/L (ref 22–32)
CREATININE: 1.35 mg/dL — AB (ref 0.61–1.24)
Calcium: 8.4 mg/dL — ABNORMAL LOW (ref 8.9–10.3)
GFR, EST NON AFRICAN AMERICAN: 55 mL/min — AB (ref >60)
Glucose, Bld: 94 mg/dL (ref 65–99)
POTASSIUM: 3.7 mmol/L (ref 3.5–5.1)
SODIUM: 138 mmol/L (ref 135–145)

## 2017-10-04 LAB — HIV ANTIBODY (ROUTINE TESTING W REFLEX): HIV SCREEN 4TH GENERATION: NONREACTIVE

## 2017-10-04 MED ORDER — DOXYCYCLINE HYCLATE 100 MG PO TABS
100.0000 mg | ORAL_TABLET | Freq: Two times a day (BID) | ORAL | Status: DC
  Administered 2017-10-04: 100 mg via ORAL
  Filled 2017-10-04: qty 1

## 2017-10-04 MED ORDER — DOXYCYCLINE HYCLATE 100 MG PO TABS: 100.0000 mg | ORAL_TABLET | Freq: Two times a day (BID) | ORAL | 0 refills | Status: AC

## 2017-10-04 MED ORDER — GUAIFENESIN ER 600 MG PO TB12: 1200.0000 mg | ORAL_TABLET | Freq: Two times a day (BID) | ORAL | 0 refills | Status: AC

## 2017-10-04 NOTE — Discharge Summary (Signed)
Physician Discharge Summary  David Simon TWS:[id number] DOB: 04-06-1956 DOA: 10/02/2017  PCP: David Euler, MD  Admit date: 10/02/2017 Discharge date: 10/04/2017  Admitted From: Home  Disposition: Home  Recommendations for Outpatient Follow-up:  1. Follow up with PCP in 1 weeks 2. Please obtain BMP/CBC in one week 3. Please follow up on the following pending results: Final culture data  Discharge Condition: STABLE   CODE STATUS: FULL    Brief Hospitalization Summary: Please see all hospital notes, images, labs for full details of the hospitalization.  HPI: David Simon is a 62 y.o. male with medical history significant of spinal stenosis; OSA; HLD; DM; and HTN presenting with respiratory symptoms.  He has been sick about 2 weeks.  He saw the doctor Tuesday and was given Augmentin for acute sinusitis.  He had a coughing fit and it caused a burning sensation in his chest with mild hemoptysis.  +subjective fevers.  He did not appear to improve at all on Augmentin.  +sick contacts but none close.  Cough is productive of green mucus.    ED Course:   Pneumonia, feeling bad for a few weeks.  Also with TB symptoms - cough, hemoptysis, weight loss; no significant risk factors.  Treated as sepsis with some improvement.  Winded after walking to the bathroom.  RLL infiltrate, so less likely TB.  Flu negative.  Brief Narrative:  62 year old male with a history of diabetes, hypertension, hyperlipidemia, presented with cough and chest pain.  Found to have community-acquired pneumonia.  He had recently completed a course of Augmentin as an outpatient.  Patient was noted to be febrile on arrival.  Started on IV antibiotics.  Overall fevers are improving.  Still somewhat short of breath with cough.  Continue IV antibiotics for last 24 hours.  If cultures are negative and remains afebrile, anticipate transitioning to oral antibiotics in a.m.  Probable discharge in next 24 hours.   Assessment & Plan:    Principal Problem:   CAP (community acquired pneumonia) Active Problems:   HTN (hypertension)   HLD (hyperlipidemia)   MDD (major depressive disorder)   OSA (obstructive sleep apnea)   Acute kidney injury superimposed on chronic kidney disease ([place])  1. Community-acquired pneumonia.  Presented with cough, fever, chest x-ray findings consistent with pneumonia.  Influenza negative.  Started on intravenous antibiotics.  Fevers resolved.  Blood culture sent and are NGTD.  Overall WBC count is normalized.  His SOB is much improved.   Discharge home on oral doxycycline and follow up with PCP.  2. AKI on CKD stage III.  Mild improvement of creatinine with hydration. Lisinopril/hydrochlorothiazide resume at discharge. 3. Hypertension.  Continue Lopressor.   4. Hyperlipidemia.  Continue statin 5. Obstructive sleep apnea.  Noncompliant with CPAP. 6. Depression.  Continue on Cymbalta and trazodone 7. Bipolar - resume home medications at discharge.  DVT prophylaxis: lovenox Code Status: full code Family Communication: discussed with wife at the bedside Disposition Plan: discharge home  Discharge Diagnoses:  Principal Problem:   CAP (community acquired pneumonia) Active Problems:   HTN (hypertension)   HLD (hyperlipidemia)   MDD (major depressive disorder)   OSA (obstructive sleep apnea)   Acute kidney injury superimposed on chronic kidney disease [facility])  Discharge Instructions: Discharge Instructions    Call MD for:  difficulty breathing, headache or visual disturbances   Complete by:  As directed    Call MD for:  extreme fatigue   Complete by:  As directed    Call MD  for:  hives   Complete by:  As directed    Call MD for:  persistant dizziness or light-headedness   Complete by:  As directed    Increase activity slowly   Complete by:  As directed      Allergies as of 10/04/2017      Reactions   Proventil [albuterol]    Increased HR       Medication List    STOP taking  these medications   amoxicillin-clavulanate 875-125 MG tablet Commonly known as:  AUGMENTIN     TAKE these medications   albuterol 108 (90 Base) MCG/ACT inhaler Commonly known as:  PROVENTIL HFA;VENTOLIN HFA Inhale 2 puffs into the lungs every 6 (six) hours as needed for wheezing or shortness of breath.   atorvastatin 10 MG tablet Commonly known as:  LIPITOR TAKE 1 TABLET BY MOUTH ONCE DAILY   doxycycline 100 MG tablet Commonly known as:  VIBRA-TABS Take 1 tablet (100 mg total) by mouth every 12 (twelve) hours for 7 days.   DULoxetine 60 MG capsule Commonly known as:  CYMBALTA TAKE 1 CAPSULE BY MOUTH ONCE DAILY AT BEDTIME.   fenofibrate 54 MG tablet TAKE 1 TABLET BY MOUTH ONCE DAILY   fluticasone 50 MCG/ACT nasal spray Commonly known as:  FLONASE Place into both nostrils daily.   guaiFENesin 600 MG 12 hr tablet Commonly known as:  MUCINEX Take 2 tablets (1,200 mg total) by mouth 2 (two) times daily for 5 days.   HYDROcodone-acetaminophen 5-325 MG tablet Commonly known as:  NORCO/VICODIN Take 1 tablet by mouth 3 (three) times daily as needed. Takes 1 a day usually   levocetirizine 5 MG tablet Commonly known as:  XYZAL TAKE 1 TABLET BY MOUTH ONCE DAILY IN THE EVENING   lisinopril-hydrochlorothiazide 20-25 MG tablet Commonly known as:  PRINZIDE,ZESTORETIC TAKE 1 TABLET BY MOUTH ONCE DAILY   metoprolol tartrate 50 MG tablet Commonly known as:  LOPRESSOR Take 1 tablet (50 mg total) by mouth 2 (two) times daily.   montelukast 10 MG tablet Commonly known as:  SINGULAIR TAKE 1 TABLET BY MOUTH ONCE DAILY AT BEDTIME   pantoprazole 40 MG tablet Commonly known as:  PROTONIX TAKE 1 TABLET BY MOUTH ONCE DAILY   sildenafil 20 MG tablet Commonly known as:  REVATIO TAKE 2 TO 5 TABLETS BY MOUTH DAILY AS NEEDED FOR ERECTILE DYSFUNCTION.   trazodone 300 MG tablet Commonly known as:  DESYREL TAKE 1 TABLET BY MOUTH ONCE DAILY AT BEDTIME AS NEEDED FOR SLEEP      Follow-up  Information    David Euler, MD. Schedule an appointment as soon as possible for a visit in 1 week(s).   Specialty:  Family Medicine Contact information: [address] [place] [address] [phone]          Allergies  Allergen Reactions  . Proventil [Albuterol]     Increased HR    Allergies as of 10/04/2017      Reactions   Proventil [albuterol]    Increased HR       Medication List    STOP taking these medications   amoxicillin-clavulanate 875-125 MG tablet Commonly known as:  AUGMENTIN     TAKE these medications   albuterol 108 (90 Base) MCG/ACT inhaler Commonly known as:  PROVENTIL HFA;VENTOLIN HFA Inhale 2 puffs into the lungs every 6 (six) hours as needed for wheezing or shortness of breath.   atorvastatin 10 MG tablet Commonly known as:  LIPITOR TAKE 1 TABLET BY  MOUTH ONCE DAILY   doxycycline 100 MG tablet Commonly known as:  VIBRA-TABS Take 1 tablet (100 mg total) by mouth every 12 (twelve) hours for 7 days.   DULoxetine 60 MG capsule Commonly known as:  CYMBALTA TAKE 1 CAPSULE BY MOUTH ONCE DAILY AT BEDTIME.   fenofibrate 54 MG tablet TAKE 1 TABLET BY MOUTH ONCE DAILY   fluticasone 50 MCG/ACT nasal spray Commonly known as:  FLONASE Place into both nostrils daily.   guaiFENesin 600 MG 12 hr tablet Commonly known as:  MUCINEX Take 2 tablets (1,200 mg total) by mouth 2 (two) times daily for 5 days.   HYDROcodone-acetaminophen 5-325 MG tablet Commonly known as:  NORCO/VICODIN Take 1 tablet by mouth 3 (three) times daily as needed. Takes 1 a day usually   levocetirizine 5 MG tablet Commonly known as:  XYZAL TAKE 1 TABLET BY MOUTH ONCE DAILY IN THE EVENING   lisinopril-hydrochlorothiazide 20-25 MG tablet Commonly known as:  PRINZIDE,ZESTORETIC TAKE 1 TABLET BY MOUTH ONCE DAILY   metoprolol tartrate 50 MG tablet Commonly known as:  LOPRESSOR Take 1 tablet (50 mg total) by mouth 2 (two) times daily.   montelukast 10 MG tablet Commonly  known as:  SINGULAIR TAKE 1 TABLET BY MOUTH ONCE DAILY AT BEDTIME   pantoprazole 40 MG tablet Commonly known as:  PROTONIX TAKE 1 TABLET BY MOUTH ONCE DAILY   sildenafil 20 MG tablet Commonly known as:  REVATIO TAKE 2 TO 5 TABLETS BY MOUTH DAILY AS NEEDED FOR ERECTILE DYSFUNCTION.   trazodone 300 MG tablet Commonly known as:  DESYREL TAKE 1 TABLET BY MOUTH ONCE DAILY AT BEDTIME AS NEEDED FOR SLEEP       Procedures/Studies: Dg Chest Port 1 View  Result Date: 10/02/2017 CLINICAL DATA:  Productive cough.  Chest pain. EXAM: PORTABLE CHEST 1 VIEW COMPARISON:  07/12/2016. FINDINGS: Mediastinum and hilar structures normal. Cardiomegaly with normal pulmonary vascularity. Right base atelectasis/infiltrate. No pleural effusion or pneumothorax. IMPRESSION: 1.  Right base atelectasis/infiltrate. 2.  Cardiomegaly with normal pulmonary vascularity. Electronically Signed   By: Marcello Moores  Register   On: 10/02/2017 14:28      Subjective: Pt says he feels much better and wants to go home, no chest pain or SOB.   Discharge Exam: Vitals:   10/03/17 2148 10/04/17 0446  BP: 138/88 (!) 144/80  Pulse: 92 87  Resp: 18   Temp: 98.7 F (37.1 C) 98.4 F (36.9 C)  SpO2: 96% 95%   Vitals:   10/03/17 0510 10/03/17 1900 10/03/17 2148 10/04/17 0446  BP:   138/88 (!) 144/80  Pulse: 85  92 87  Resp:   18   Temp: 99 F (37.2 C)  98.7 F (37.1 C) 98.4 F (36.9 C)  TempSrc: Oral  Oral Oral  SpO2: 93% 93% 96% 95%  Weight:      Height:        General: Pt is alert, awake, not in acute distress Cardiovascular: RRR, S1/S2 +, no rubs, no gallops Respiratory: CTA bilaterally, no wheezing, no rhonchi Abdominal: Soft, NT, ND, bowel sounds + Extremities: no edema, no cyanosis   The results of significant diagnostics from this hospitalization (including imaging, microbiology, ancillary and laboratory) are listed below for reference.     Microbiology: Recent Results (from the past 240 hour(s))  Blood  Culture (routine x 2)     Status: None (Preliminary result)   Collection Time: 10/02/17  3:28 PM  Result Value Ref Range Status   Specimen Description LEFT ANTECUBITAL  Final   Special Requests   Final    BOTTLES DRAWN AEROBIC AND ANAEROBIC Blood Culture adequate volume   Culture   Final    NO GROWTH < 24 HOURS Performed at [facility], [address] [place], [place] [address]    Report Status PENDING  Incomplete  Blood Culture (routine x 2)     Status: None (Preliminary result)   Collection Time: 10/02/17  3:29 PM  Result Value Ref Range Status   Specimen Description LEFT ANTECUBITAL  Final   Special Requests   Final    BOTTLES DRAWN AEROBIC AND ANAEROBIC Blood Culture adequate volume   Culture   Final    NO GROWTH < 24 HOURS Performed at [facility], [address], [place], [place] [address]    Report Status PENDING  Incomplete     Labs: BNP (last 3 results) No results for input(s): BNP in the last 8760 hours. Basic Metabolic Panel: Recent Labs  Lab 10/02/17 1528 10/03/17 0609 10/04/17 0515  NA 133* 138 138  K 3.5 3.6 3.7  CL 98* 101 105  CO2 20* 24 22  GLUCOSE 112* 101* 94  BUN 26* 24* 21*  CREATININE 1.82* 1.76* 1.35*  CALCIUM 9.0 8.7* 8.4*   Liver Function Tests: Recent Labs  Lab 10/02/17 1528  AST 18  ALT 11*  ALKPHOS 33*  BILITOT 1.4*  PROT 7.4  ALBUMIN 3.5   No results for input(s): LIPASE, AMYLASE in the last 168 hours. No results for input(s): AMMONIA in the last 168 hours. CBC: Recent Labs  Lab 10/02/17 1528 10/03/17 0609  WBC 14.4* 10.4  NEUTROABS 11.9* 8.1*  HGB 12.4* 11.0*  HCT 36.9* 34.2*  MCV 84.2 85.1  PLT 256 265   Cardiac Enzymes: No results for input(s): CKTOTAL, CKMB, CKMBINDEX, TROPONINI in the last 168 hours. BNP: Invalid input(s): POCBNP CBG: No results for input(s): GLUCAP in the last 168 hours. D-Dimer No results for input(s): DDIMER in the last 72 hours. Hgb A1c No results for input(s): HGBA1C in the last 72  hours. Lipid Profile No results for input(s): CHOL, HDL, LDLCALC, TRIG, CHOLHDL, LDLDIRECT in the last 72 hours. Thyroid function studies No results for input(s): TSH, T4TOTAL, T3FREE, THYROIDAB in the last 72 hours.  Invalid input(s): FREET3 Anemia work up No results for input(s): VITAMINB12, FOLATE, FERRITIN, TIBC, IRON, RETICCTPCT in the last 72 hours. Urinalysis    Component Value Date/Time   COLORURINE YELLOW 10/02/2017 1503   APPEARANCEUR HAZY (A) 10/02/2017 1503   LABSPEC 1.019 10/02/2017 1503   PHURINE 5.0 10/02/2017 1503   GLUCOSEU NEGATIVE 10/02/2017 1503   HGBUR NEGATIVE 10/02/2017 1503   BILIRUBINUR NEGATIVE 10/02/2017 1503   KETONESUR 5 (A) 10/02/2017 1503   PROTEINUR NEGATIVE 10/02/2017 1503   NITRITE NEGATIVE 10/02/2017 1503   LEUKOCYTESUR NEGATIVE 10/02/2017 1503   Sepsis Labs Invalid input(s): PROCALCITONIN,  WBC,  LACTICIDVEN Microbiology Recent Results (from the past 240 hour(s))  Blood Culture (routine x 2)     Status: None (Preliminary result)   Collection Time: 10/02/17  3:28 PM  Result Value Ref Range Status   Specimen Description LEFT ANTECUBITAL  Final   Special Requests   Final    BOTTLES DRAWN AEROBIC AND ANAEROBIC Blood Culture adequate volume   Culture   Final    NO GROWTH < 24 HOURS Performed at [facility], [address], [place], [place] [address]    Report Status PENDING  Incomplete  Blood Culture (routine x 2)  Status: None (Preliminary result)   Collection Time: 10/02/17  3:29 PM  Result Value Ref Range Status   Specimen Description LEFT ANTECUBITAL  Final   Special Requests   Final    BOTTLES DRAWN AEROBIC AND ANAEROBIC Blood Culture adequate volume   Culture   Final    NO GROWTH < 24 HOURS Performed at [facility], [address] [place], [place] [address]    Report Status PENDING  Incomplete   Time coordinating discharge: 32 minutes  SIGNED:  Irwin Brakeman, MD  [facility] 10/04/2017, 10:06 AM Pager [phone]  [phone]  If 7PM-7AM, please contact night-coverage [email or web address] Password [email or web address]

## 2017-10-04 NOTE — Progress Notes (Signed)
Discharge instructions gone over with patient, verbalized understanding. IV removed, patient tolerated procedure well. 

## 2017-10-04 NOTE — Discharge Instructions (Signed)
1. Follow up with PCP in 1 weeks 2. Please obtain BMP/CBC in one week 3. Please follow up on the following pending results: Final culture data        Follow with Primary MD  Timmothy Euler, MD  and other consultant's as instructed your Hospitalist MD  Please get a complete blood count and chemistry panel checked by your Primary MD at your next visit, and again as instructed by your Primary MD.  Get Medicines reviewed and adjusted: Please take all your medications with you for your next visit with your Primary MD  Laboratory/radiological data: Please request your Primary MD to go over all hospital tests and procedure/radiological results at the follow up, please ask your Primary MD to get all Hospital records sent to his/her office.  In some cases, they will be blood work, cultures and biopsy results pending at the time of your discharge. Please request that your primary care M.D. follows up on these results.  Also Note the following: If you experience worsening of your admission symptoms, develop shortness of breath, life threatening emergency, suicidal or homicidal thoughts you must seek medical attention immediately by calling 911 or calling your MD immediately  if symptoms less severe.  You must read complete instructions/literature along with all the possible adverse reactions/side effects for all the Medicines you take and that have been prescribed to you. Take any new Medicines after you have completely understood and accpet all the possible adverse reactions/side effects.   Do not drive when taking Pain medications or sleeping medications (Benzodaizepines)  Do not take more than prescribed Pain, Sleep and Anxiety Medications. It is not advisable to combine anxiety,sleep and pain medications without talking with your primary care practitioner  Special Instructions: If you have smoked or chewed Tobacco  in the last 2 yrs please stop smoking, stop any regular Alcohol  and or  any Recreational drug use.  Wear Seat belts while driving.  Please note: You were cared for by a hospitalist during your hospital stay. Once you are discharged, your primary care physician will handle any further medical issues. Please note that NO REFILLS for any discharge medications will be authorized once you are discharged, as it is imperative that you return to your primary care physician (or establish a relationship with a primary care physician if you do not have one) for your post hospital discharge needs so that they can reassess your need for medications and monitor your lab values.      Community-Acquired Pneumonia, Adult Pneumonia is an infection of the lungs. One type of pneumonia can happen while a person is in a hospital. A different type can happen when a person is not in a hospital (community-acquired pneumonia). It is easy for this kind to spread from person to person. It can spread to you if you breathe near an infected person who coughs or sneezes. Some symptoms include:  A dry cough.  A wet (productive) cough.  Fever.  Sweating.  Chest pain.  Follow these instructions at home:  Take over-the-counter and prescription medicines only as told by your doctor. ? Only take cough medicine if you are losing sleep. ? If you were prescribed an antibiotic medicine, take it as told by your doctor. Do not stop taking the antibiotic even if you start to feel better.  Sleep with your head and neck raised (elevated). You can do this by putting a few pillows under your head, or you can sleep in a recliner.  Do  not use tobacco products. These include cigarettes, chewing tobacco, and e-cigarettes. If you need help quitting, ask your doctor.  Drink enough water to keep your pee (urine) clear or pale yellow. A shot (vaccine) can help prevent pneumonia. Shots are often suggested for:  People older than 62 years of age.  People older than 61 years of age: ? Who are having cancer  treatment. ? Who have long-term (chronic) lung disease. ? Who have problems with their body's defense system (immune system).  You may also prevent pneumonia if you take these actions:  Get the flu (influenza) shot every year.  Go to the dentist as often as told.  Wash your hands often. If soap and water are not available, use hand sanitizer.  Contact a doctor if:  You have a fever.  You lose sleep because your cough medicine does not help. Get help right away if:  You are short of breath and it gets worse.  You have more chest pain.  Your sickness gets worse. This is very serious if: ? You are an older adult. ? Your body's defense system is weak.  You cough up blood. This information is not intended to replace advice given to you by your health care provider. Make sure you discuss any questions you have with your health care provider. Document Released: 12/04/2007 Document Revised: 11/23/2015 Document Reviewed: 10/12/2014 Elsevier Interactive Patient Education  2018 [place].   Cough, Adult A cough helps to clear your throat and lungs. A cough may last only 2-3 weeks (acute), or it may last longer than 8 weeks (chronic). Many different things can cause a cough. A cough may be a sign of an illness or another medical condition. Follow these instructions at home:  Pay attention to any changes in your cough.  Take medicines only as told by your doctor. ? If you were prescribed an antibiotic medicine, take it as told by your doctor. Do not stop taking it even if you start to feel better. ? Talk with your doctor before you try using a cough medicine.  Drink enough fluid to keep your pee (urine) clear or pale yellow.  If the air is dry, use a cold steam vaporizer or humidifier in your home.  Stay away from things that make you cough at work or at home.  If your cough is worse at night, try using extra pillows to raise your head up higher while you sleep.  Do not  smoke, and try not to be around smoke. If you need help quitting, ask your doctor.  Do not have caffeine.  Do not drink alcohol.  Rest as needed. Contact a doctor if:  You have new problems (symptoms).  You cough up yellow fluid (pus).  Your cough does not get better after 2-3 weeks, or your cough gets worse.  Medicine does not help your cough and you are not sleeping well.  You have pain that gets worse or pain that is not helped with medicine.  You have a fever.  You are losing weight and you do not know why.  You have night sweats. Get help right away if:  You cough up blood.  You have trouble breathing.  Your heartbeat is very fast. This information is not intended to replace advice given to you by your health care provider. Make sure you discuss any questions you have with your health care provider. Document Released: 02/28/2011 Document Revised: 11/23/2015 Document Reviewed: 08/24/2014 Elsevier Interactive Patient Education  2018  [place], Adult An allergy is when your body's defense system (immune system) overreacts to an otherwise harmless substance (allergen) that you breathe in or eat or something that touches your skin. When you come into contact with something that you are allergic to, your immune system produces certain proteins (antibodies). These proteins cause cells to release chemicals (histamines) that trigger the symptoms of an allergic reaction. Allergies often affect the nasal passages (allergic rhinitis), eyes (allergic conjunctivitis), skin (atopic dermatitis), and stomach. Allergies can be mild or severe. Allergies cannot spread from person to person (are not contagious). They can develop at any age and may be outgrown. What increases the risk? You may be at greater risk of allergies if other people in your family have allergies. What are the signs or symptoms? Symptoms depend on what type of allergy you have. They may  include:  Runny, stuffy nose.  Sneezing.  Itchy mouth, ears, or throat.  Postnasal drip.  Sore throat.  Itchy, red, watery, or puffy eyes.  Skin rash or hives.  Stomach pain.  Vomiting.  Diarrhea.  Bloating.  Wheezing or coughing.  People with a severe allergy to food, medicine, or an insect bite may have a life-threatening allergic reaction (anaphylaxis). Symptoms of anaphylaxis include:  Hives.  Itching.  Flushed face.  Swollen lips, tongue, or mouth.  Tight or swollen throat.  Chest pain or tightness in the chest.  Trouble breathing or shortness of breath.  Rapid heartbeat.  Dizziness or fainting.  Vomiting.  Diarrhea.  Pain in the abdomen.  How is this diagnosed? This condition is diagnosed based on:  Your symptoms.  Your family and medical history.  A physical exam.  You may need to see a health care provider who specializes in treating allergies (allergist). You may also have tests, including:  Skin tests to see which allergens are causing your symptoms, such as: ? Skin prick test. In this test, your skin is pricked with a tiny needle and exposed to small amounts of possible allergens to see if your skin reacts. ? Intradermal skin test. In this test, a small amount of allergen is injected under your skin to see if your skin reacts. ? Patch test. In this test, a small amount of allergen is placed on your skin and then your skin is covered with a bandage. Your health care provider will check your skin after a couple of days to see if a rash has developed.  Blood tests.  Challenges tests. In this test, you inhale a small amount of allergen by mouth to see if you have an allergic reaction.  You may also be asked to:  Keep a food diary. A food diary is a record of all the foods and drinks you have in a day and any symptoms you experience.  Practice an elimination diet. An elimination diet involves eliminating specific foods from your diet  and then adding them back in one by one to find out if a certain food causes an allergic reaction.  How is this treated? Treatment for allergies depends on your symptoms. Treatment may include:  Cold compresses to soothe itching and swelling.  Eye drops.  Nasal sprays.  Using a saline spray or container (neti pot) to flush out the nose (nasal irrigation). These methods can help clear away mucus and keep the nasal passages moist.  Using a humidifier.  Oral antihistamines or other medicines to block allergic reaction and inflammation.  Skin creams to treat rashes or  itching.  Diet changes to eliminate food allergy triggers.  Repeated exposure to tiny amounts of allergens to build up a tolerance and prevent future allergic reactions (immunotherapy). These include: ? Allergy shots. ? Oral treatment. This involves taking small doses of an allergen under the tongue (sublingual immunotherapy).  Emergency epinephrine injection (auto-injector) in case of an allergic emergency. This is a self-injectable, pre-measured medicine that must be given within the first few minutes of a serious allergic reaction.  Follow these instructions at home:  Avoid known allergens whenever possible.  If you suffer from airborne allergens, wash out your nose daily. You can do this with a saline spray or a neti pot to flush out your nose (nasal irrigation).  Take over-the-counter and prescription medicines only as told by your health care provider.  Keep all follow-up visits as told by your health care provider. This is important.  If you are at risk of a severe allergic reaction (anaphylaxis), keep your auto-injector with you at all times.  If you have ever had anaphylaxis, wear a medical alert bracelet or necklace that states you have a severe allergy. Contact a health care provider if:  Your symptoms do not improve with treatment. Get help right away if:  You have symptoms of anaphylaxis, such  as: ? Swollen mouth, tongue, or throat. ? Pain or tightness in your chest. ? Trouble breathing or shortness of breath. ? Dizziness or fainting. ? Severe abdominal pain, vomiting, or diarrhea. This information is not intended to replace advice given to you by your health care provider. Make sure you discuss any questions you have with your health care provider. Document Released: 09/10/2002 Document Revised: 10/16/2016 Document Reviewed: 01/03/2016 Elsevier Interactive Patient Education  [employer].

## 2017-10-06 ENCOUNTER — Telehealth: Payer: Self-pay | Admitting: Family Medicine

## 2017-10-07 ENCOUNTER — Ambulatory Visit: Payer: Medicare Other | Admitting: Family Medicine

## 2017-10-07 LAB — CULTURE, BLOOD (ROUTINE X 2)
Culture: NO GROWTH
Culture: NO GROWTH
SPECIAL REQUESTS: ADEQUATE
Special Requests: ADEQUATE

## 2017-10-07 NOTE — Telephone Encounter (Signed)
Refer to other phone note . 

## 2017-10-10 ENCOUNTER — Encounter: Payer: Self-pay | Admitting: Family Medicine

## 2017-10-10 ENCOUNTER — Ambulatory Visit (INDEPENDENT_AMBULATORY_CARE_PROVIDER_SITE_OTHER): Payer: Medicare Other | Admitting: Family Medicine

## 2017-10-10 VITALS — BP 114/78 | HR 63 | Temp 98.2°F | Ht 70.0 in | Wt 226.0 lb

## 2017-10-10 DIAGNOSIS — J181 Lobar pneumonia, unspecified organism: Secondary | ICD-10-CM | POA: Diagnosis not present

## 2017-10-10 DIAGNOSIS — R3912 Poor urinary stream: Secondary | ICD-10-CM | POA: Diagnosis not present

## 2017-10-10 DIAGNOSIS — N401 Enlarged prostate with lower urinary tract symptoms: Secondary | ICD-10-CM

## 2017-10-10 DIAGNOSIS — J189 Pneumonia, unspecified organism: Secondary | ICD-10-CM

## 2017-10-10 NOTE — Progress Notes (Signed)
   HPI  Patient presents today hospital follow-up.  Patient was seen in the hospital for pneumonia.  He was treated with IV antibiotics and the escalated to doxycycline.  He was seen 2 days prior to admission in our practice and treated with Augmentin.  Patient states that he is feeling better, his appetite is not back to normal yet. Shortness of breath and cough have improved.  States that off and on for a few years he has had weak stream and 3-4 episodes of nocturia. He will consider Flomax He would like PSA,  PMH: Smoking status noted ROS: Per HPI  Objective: BP 114/78 (BP Location: Left Arm)   Pulse 63   Temp 98.2 F (36.8 C) (Oral)   Ht _0  (1.778 m)   Wt 226 lb (102.5 kg)   SpO2 97%   BMI 32.43 kg/m  Gen: NAD, alert, cooperative with exam HEENT: NCAT CV: RRR, good S1/S2, no murmur Resp: CTABL, no wheezes, non-labored Ext: No edema, warm Neuro: Alert and oriented, No gross deficits  Assessment and plan:  #Community-acquired pneumonia Improving, patient still finishing doxycycline No changes Patient with mild AK I while admitted, repeat labs today.   #BPH Patient with weak stream and 3-4 episodes of nocturia at night Offered Flomax, he will consider PSA   Orders Placed This Encounter  Procedures  . BMP8+EGFR  . CBC with Differential/Platelet  . PSA    No orders of the defined types were placed in this encounter.   Laroy Apple, MD [facility] 10/10/2017, 2:38 PM

## 2017-10-11 ENCOUNTER — Other Ambulatory Visit: Payer: Self-pay | Admitting: Family Medicine

## 2017-10-11 LAB — CBC WITH DIFFERENTIAL/PLATELET
BASOS: 1 %
Basophils Absolute: 0.1 10*3/uL (ref 0.0–0.2)
EOS (ABSOLUTE): 0.2 10*3/uL (ref 0.0–0.4)
EOS: 2 %
HEMATOCRIT: 36.4 % — AB (ref 37.5–51.0)
Hemoglobin: 12.2 g/dL — ABNORMAL LOW (ref 13.0–17.7)
IMMATURE GRANULOCYTES: 0 %
Immature Grans (Abs): 0 10*3/uL (ref 0.0–0.1)
Lymphocytes Absolute: 1.9 10*3/uL (ref 0.7–3.1)
Lymphs: 25 %
MCH: 28.2 pg (ref 26.6–33.0)
MCHC: 33.5 g/dL (ref 31.5–35.7)
MCV: 84 fL (ref 79–97)
Monocytes Absolute: 0.7 10*3/uL (ref 0.1–0.9)
Monocytes: 10 %
NEUTROS ABS: 4.7 10*3/uL (ref 1.4–7.0)
NEUTROS PCT: 62 %
PLATELETS: 459 10*3/uL — AB (ref 150–379)
RBC: 4.33 x10E6/uL (ref 4.14–5.80)
RDW: 13.3 % (ref 12.3–15.4)
WBC: 7.5 10*3/uL (ref 3.4–10.8)

## 2017-10-11 LAB — BMP8+EGFR
BUN/Creatinine Ratio: 17 (ref 10–24)
BUN: 24 mg/dL (ref 8–27)
CO2: 21 mmol/L (ref 20–29)
CREATININE: 1.42 mg/dL — AB (ref 0.76–1.27)
Calcium: 9.8 mg/dL (ref 8.6–10.2)
Chloride: 99 mmol/L (ref 96–106)
GFR, EST AFRICAN AMERICAN: 61 mL/min/{1.73_m2} (ref >59)
GFR, EST NON AFRICAN AMERICAN: 53 mL/min/{1.73_m2} — AB (ref >59)
Glucose: 81 mg/dL (ref 65–99)
POTASSIUM: 4.4 mmol/L (ref 3.5–5.2)
Sodium: 139 mmol/L (ref 134–144)

## 2017-10-11 LAB — PSA: Prostate Specific Ag, Serum: 1 ng/mL (ref 0.0–4.0)

## 2017-10-13 NOTE — Telephone Encounter (Signed)
Patient was seen 4/12

## 2017-10-14 ENCOUNTER — Other Ambulatory Visit: Payer: Self-pay

## 2017-10-14 DIAGNOSIS — D691 Qualitative platelet defects: Secondary | ICD-10-CM

## 2017-10-28 ENCOUNTER — Ambulatory Visit (INDEPENDENT_AMBULATORY_CARE_PROVIDER_SITE_OTHER): Payer: Medicare Other | Admitting: Family Medicine

## 2017-10-28 ENCOUNTER — Encounter: Payer: Self-pay | Admitting: Family Medicine

## 2017-10-28 VITALS — BP 112/69 | HR 64 | Temp 98.2°F | Ht 70.0 in | Wt 224.4 lb

## 2017-10-28 DIAGNOSIS — M791 Myalgia, unspecified site: Secondary | ICD-10-CM

## 2017-10-28 DIAGNOSIS — W57XXXA Bitten or stung by nonvenomous insect and other nonvenomous arthropods, initial encounter: Secondary | ICD-10-CM | POA: Diagnosis not present

## 2017-10-28 DIAGNOSIS — J0111 Acute recurrent frontal sinusitis: Secondary | ICD-10-CM | POA: Diagnosis not present

## 2017-10-28 MED ORDER — DOXYCYCLINE HYCLATE 100 MG PO TABS: 100.0000 mg | ORAL_TABLET | Freq: Two times a day (BID) | ORAL | 0 refills | Status: AC

## 2017-10-28 NOTE — Patient Instructions (Signed)
Your lung exam is clear today.  There is no evidence of bacterial sinus infection on your exam.  I am however going to put you on an antibiotic to cover for possible Lyme disease given your myalgias/body aches and known tick bites.  Take the doxycycline twice a day with food as directed for the next week.  Follow-up if your symptoms are worsening or not improving.   Tick Bite Information, Adult Ticks are insects that can bite. Most ticks live in shrubs and grassy areas. They climb onto people and animals that go by. Then they bite. Some ticks carry germs that can make you sick. How can I prevent tick bites?  Use an insect repellent that has 20% or higher of the ingredients DEET, picaridin, or IR3535. Put this insect repellent on: ? Bare skin. ? The tops of your boots. ? Your pant legs. ? The ends of your sleeves.  If you use an insect repellent that has the ingredient permethrin, make sure to follow the instructions on the bottle. Treat the following: ? Clothing. ? Supplies. ? Boots. ? Tents.  Wear long sleeves, long pants, and light colors.  Tuck your pant legs into your socks.  Stay in the middle of the trail.  Try not to walk through long grass.  Before going inside your house, check your clothes, hair, and skin for ticks. Make sure to check your head, neck, armpits, waist, groin, and joint areas.  Check for ticks every day.  When you come indoors: ? Wash your clothes right away. ? Shower right away. ? Dry your clothes in a dryer on high heat for 60 minutes or more. What is the right way to remove a tick? Remove a tick from your skin as soon as possible.  To remove a tick that is crawling on your skin: ? Go outdoors and brush the tick off. ? Use tape or a lint roller.  To remove a tick that is biting: ? Wash your hands. ? If you have latex gloves, put them on. ? Use tweezers, curved forceps, or a tick-removal tool to grasp the tick. Grasp the tick as close to your skin  and as close to the tick's head as possible. ? Gently pull up until the tick lets go.  Try to keep the tick's head attached to its body.  Do not twist or jerk the tick.  Do not squeeze or crush the tick.  Do not try to remove a tick with heat, alcohol, petroleum jelly, or fingernail polish. How should I get rid of a tick? Here are some ways to get rid of a tick that is alive:  Place the tick in rubbing alcohol.  Place the tick in a bag or container you can close tightly.  Wrap the tick tightly in tape.  Flush the tick down the toilet.  Contact a doctor if:  You have symptoms of a disease, such as: ? Pain in a muscle, joint, or bone. ? Trouble walking or moving your legs. ? Numbness in your legs. ? Inability to move (paralysis). ? A red rash that makes a circle (bull's-eye rash). ? Redness and swelling where the tick bit you. ? A fever. ? Throwing up (vomiting) over and over. ? Diarrhea. ? Weight loss. ? Tender and swollen lymph glands. ? Shortness of breath. ? Cough. ? Belly pain (abdominal pain). ? Headache. ? Being more tired than normal. ? A change in how alert (conscious) you are. ? Confusion. Get help right  away if:  You cannot remove a tick.  A part of a tick breaks off and gets stuck in your skin.  You are feeling worse. Summary  Ticks may carry germs that can make you sick.  To prevent tick bites, wear long sleeves, long pants, and light colors. Use insect repellent. Follow the instructions on the bottle.  If the tick is biting, do not try to remove it with heat, alcohol, petroleum jelly, or fingernail polish.  Use tweezers, curved forceps, or a tick-removal tool to grasp the tick. Gently pull up until the tick lets go. Do not twist or jerk the tick. Do not squeeze or crush the tick.  If you have symptoms, contact a doctor. This information is not intended to replace advice given to you by your health care provider. Make sure you discuss any  questions you have with your health care provider. Document Released: 09/11/2009 Document Revised: 09/27/2016 Document Reviewed: 09/27/2016 Elsevier Interactive Patient Education  2018 [employer].

## 2017-10-28 NOTE — Progress Notes (Signed)
Subjective: CC: tick bite/ sinus symptoms PCP: Timmothy Euler, MD David Simon is a 61 y.o. male presenting to clinic today for:  1. Tick bite Patient reports he sustained to take bites to the abdomen about 1 week ago.  He notes that he has surrounding erythema and irritation.  Denies target lesion rash.  He notes associated fatigue and myalgia.  Denies any fevers but does report chills.  He is unsure if symptoms are related to tick bite or sinus symptoms.  2. Sinus symptoms Patient reports sinus congestion, fatigue, myalgia and chills.  He notes that he was actually recently diagnosed and treated for a pneumonia that started out as a sinus infection.  He notes that he wanted to make sure he got ahead of things to prevent recurrent pneumonia.   ROS: Per HPI  Allergies  Allergen Reactions  . Proventil [Albuterol]     Increased HR    Past Medical History:  Diagnosis Date  . Allergy   . Anxiety    anti depressants help   . Arthritis   . Asthma    as a child   . Back pain   . Depression   . Essential hypertension   . Fracture of neck ([place])    Traumatic event 2008  . History of diabetes mellitus    as a child - no longer has   . Hypercholesterolemia   . Neck pain   . Seasonal allergies   . Sleep apnea   . Spinal stenosis    herniated disc, pinched nerve  --- pain clinic and [facility] ([place])    Current Outpatient Medications:  .  albuterol (PROVENTIL HFA;VENTOLIN HFA) 108 (90 Base) MCG/ACT inhaler, Inhale 2 puffs into the lungs every 6 (six) hours as needed for wheezing or shortness of breath., Disp: 1 Inhaler, Rfl: 0 .  atorvastatin (LIPITOR) 10 MG tablet, TAKE 1 TABLET BY MOUTH ONCE DAILY, Disp: 90 tablet, Rfl: 0 .  DULoxetine (CYMBALTA) 60 MG capsule, TAKE 1 CAPSULE BY MOUTH ONCE DAILY AT BEDTIME., Disp: 90 capsule, Rfl: 0 .  fenofibrate 54 MG tablet, TAKE 1 TABLET BY MOUTH ONCE DAILY, Disp: 90 tablet, Rfl: 1 .  fluticasone (FLONASE) 50 MCG/ACT nasal spray,  Place into both nostrils daily., Disp: , Rfl:  .  HYDROcodone-acetaminophen (NORCO/VICODIN) 5-325 MG tablet, Take 1 tablet by mouth 3 (three) times daily as needed. Takes 1 a day usually, Disp: , Rfl:  .  levocetirizine (XYZAL) 5 MG tablet, TAKE 1 TABLET BY MOUTH ONCE DAILY IN THE EVENING, Disp: 30 tablet, Rfl: 3 .  lisinopril-hydrochlorothiazide (PRINZIDE,ZESTORETIC) 20-25 MG tablet, TAKE 1 TABLET BY MOUTH ONCE DAILY, Disp: 90 tablet, Rfl: 1 .  metoprolol tartrate (LOPRESSOR) 50 MG tablet, Take 1 tablet (50 mg total) by mouth 2 (two) times daily., Disp: 180 tablet, Rfl: 3 .  montelukast (SINGULAIR) 10 MG tablet, TAKE 1 TABLET BY MOUTH ONCE DAILY AT BEDTIME, Disp: 90 tablet, Rfl: 0 .  pantoprazole (PROTONIX) 40 MG tablet, TAKE 1 TABLET BY MOUTH ONCE DAILY, Disp: 90 tablet, Rfl: 1 .  sildenafil (REVATIO) 20 MG tablet, TAKE 2 TO 5 TABLETS BY MOUTH DAILY AS NEEDED FOR ERECTILE DYSFUNCTION., Disp: 50 tablet, Rfl: 5 .  trazodone (DESYREL) 300 MG tablet, TAKE 1 TABLET BY MOUTH ONCE DAILY AT BEDTIME AS NEEDED FOR SLEEP, Disp: 90 tablet, Rfl: 1 Social History   Socioeconomic History  . Marital status: Divorced    Spouse name: Not on file  . Number of children: 1  .  Years of education: Not on file  . Highest education level: Not on file  Occupational History  . Occupation: disability    Comment: union iron worker  - [place]   Social Needs  . Financial resource strain: Not on file  . Food insecurity:    Worry: Not on file    Inability: Not on file  . Transportation needs:    Medical: Not on file    Non-medical: Not on file  Tobacco Use  . Smoking status: Current Some Day Smoker    Packs/day: 0.00    Types: Cigars  . Smokeless tobacco: Never Used  . Tobacco comment: smokes a cigar maybe once a month  Substance and Sexual Activity  . Alcohol use: Yes    Alcohol/week: 0.0 oz    Comment: very seldom  . Drug use: No  . Sexual activity: Yes  Lifestyle  . Physical activity:    Days per week: Not  on file    Minutes per session: Not on file  . Stress: Not on file  Relationships  . Social connections:    Talks on phone: Not on file    Gets together: Not on file    Attends religious service: Not on file    Active member of club or organization: Not on file    Attends meetings of clubs or organizations: Not on file    Relationship status: Not on file  . Intimate partner violence:    Fear of current or ex partner: Not on file    Emotionally abused: Not on file    Physically abused: Not on file    Forced sexual activity: Not on file  Other Topics Concern  . Not on file  Social History Narrative  . Not on file   Family History  Problem Relation Age of Onset  . Hypertension Mother   . High Cholesterol Mother   . Arthritis Mother   . Cancer Mother        bladder   . Other Mother        oxygen use   . Heart disease Father   . Heart attack Father   . Hyperlipidemia Sister   . Hypertension Sister   . Hypertension Brother   . Hyperlipidemia Brother   . Arthritis Brother   . Diabetes Maternal Grandmother   . Heart attack Maternal Grandfather   . Tuberculosis Paternal Grandmother        and other breathing problems  . Heart attack Paternal Grandfather     Objective: Office vital signs reviewed. BP 112/69   Pulse 64   Temp 98.2 F (36.8 C) (Oral)   Ht 5\' 10"  (1.778 m)   Wt 224 lb 6.4 oz (101.8 kg)   BMI 32.20 kg/m   Physical Examination:  General: Awake, alert, nontoxic, No acute distress HEENT: Normal    Neck: No masses palpated. No lymphadenopathy    Ears: Tympanic membranes intact, normal light reflex, no erythema, no bulging    Eyes: PERRLA, extraocular membranes intact, sclera white    Nose: nasal turbinates moist, clear nasal discharge    Throat: moist mucus membranes, no erythema, no tonsillar exudate.  Airway is patent Cardio: regular rate and rhythm, S1S2 heard, no murmurs appreciated Pulm: clear to auscultation bilaterally, no wheezes, rhonchi or  rales; normal work of breathing on room air Skin: 2 small insect bites appreciated along the anterior abdomen in the midline.  There is minimal surrounding erythema.  No palpable induration or fluctuance.  No purulence from lesions.  Assessment/ Plan: 62 y.o. male   1. Tick bite, initial encounter Patient is afebrile and nontoxic-appearing on today's exam.  2 visible insect bites appreciated along the anterior abdomen.  Because of his associated myalgia, fatigue and chills, I have elected to start doxycycline p.o. twice daily for the next 7 days.  Lyme titer at this point would be of low value given recent onset/exposure to tick.  I recommended protective clothing and use of insect repellent during outdoor activities.  2. Myalgia As above  3. Acute recurrent frontal sinusitis No evidence of bacterial infection on today's exam.  His pulmonary exam was unremarkable.  Antibiotics for tick bite as above.    Meds ordered this encounter  Medications  . doxycycline (VIBRA-TABS) 100 MG tablet    Sig: Take 1 tablet (100 mg total) by mouth 2 (two) times daily for 7 days.    Dispense:  14 tablet    Refill:  [place], DO [place] [phone]

## 2017-10-29 ENCOUNTER — Ambulatory Visit: Payer: Medicare Other | Admitting: Urology

## 2017-10-31 ENCOUNTER — Ambulatory Visit: Payer: Medicare Other | Admitting: Family Medicine

## 2017-11-03 ENCOUNTER — Encounter: Payer: Self-pay | Admitting: Family Medicine

## 2017-11-03 ENCOUNTER — Ambulatory Visit (INDEPENDENT_AMBULATORY_CARE_PROVIDER_SITE_OTHER): Payer: Medicare Other | Admitting: Family Medicine

## 2017-11-03 ENCOUNTER — Ambulatory Visit (INDEPENDENT_AMBULATORY_CARE_PROVIDER_SITE_OTHER): Payer: Medicare Other

## 2017-11-03 VITALS — BP 125/80 | HR 86 | Temp 97.8°F | Ht 70.0 in | Wt 223.2 lb

## 2017-11-03 DIAGNOSIS — R059 Cough, unspecified: Secondary | ICD-10-CM

## 2017-11-03 DIAGNOSIS — R079 Chest pain, unspecified: Secondary | ICD-10-CM | POA: Diagnosis not present

## 2017-11-03 DIAGNOSIS — R05 Cough: Secondary | ICD-10-CM | POA: Diagnosis not present

## 2017-11-03 NOTE — Progress Notes (Signed)
   HPI  Patient presents today here with left lung pain.  Patient describes as a sharp chest pain in the left lower chest and left lower back similar to his previous episode of pneumonia.  He was treated for pneumonia in the hospital about a month ago.  Last week he was seen and treated for sinusitis with doxycycline.   Symptoms of cough, nasal congestion, left lung pain going on for 3 days.   He is tolerating food and fluids like usual.  Patient states that he did get better after his previous episode of pneumonia  PMH: Smoking status noted ROS: Per HPI  Objective: BP 125/80   Pulse 86   Temp 97.8 F (36.6 C) (Oral)   Ht 5\' 10"  (1.778 m)   Wt 223 lb 3.2 oz (101.2 kg)   SpO2 95%   BMI 32.03 kg/m  Gen: NAD, alert, cooperative with exam HEENT: NCAT CV: RRR, good S1/S2, no murmur Resp: Nonlabored, good air movement, crackles in the right base, clear left base Ext: No edema, warm Neuro: Alert and oriented, No gross deficits  Assessment and plan:  #Cough Patient currently on doxycycline, plain film to rule out underlying pneumonia and showed resolution of previous right lower lobe infiltrate    Orders Placed This Encounter  Procedures  . DG Chest 2 View    Standing Status:   Future    Standing Expiration Date:   01/04/2019    Order Specific Question:   Reason for Exam (SYMPTOM  OR DIAGNOSIS REQUIRED)    Answer:   Cough, new L sided pleurisy and had infiltrtate on R lower last month    Order Specific Question:   Preferred imaging location?    Answer:   Internal    Order Specific Question:   Radiology Contrast Protocol - do NOT remove file path    Answer:   [email or web address]    Laroy Apple, MD [facility] 11/03/2017, 3:02 PM

## 2017-11-08 ENCOUNTER — Other Ambulatory Visit: Payer: Self-pay | Admitting: Family Medicine

## 2017-12-22 ENCOUNTER — Other Ambulatory Visit: Payer: Self-pay | Admitting: Family Medicine

## 2017-12-23 DIAGNOSIS — M25552 Pain in left hip: Secondary | ICD-10-CM | POA: Diagnosis not present

## 2017-12-23 DIAGNOSIS — M545 Low back pain: Secondary | ICD-10-CM | POA: Diagnosis not present

## 2017-12-23 DIAGNOSIS — M5137 Other intervertebral disc degeneration, lumbosacral region: Secondary | ICD-10-CM | POA: Diagnosis not present

## 2017-12-23 DIAGNOSIS — Z79899 Other long term (current) drug therapy: Secondary | ICD-10-CM | POA: Diagnosis not present

## 2017-12-23 DIAGNOSIS — M25551 Pain in right hip: Secondary | ICD-10-CM | POA: Diagnosis not present

## 2018-01-02 ENCOUNTER — Ambulatory Visit: Payer: Medicare Other | Admitting: Family Medicine

## 2018-01-06 DIAGNOSIS — I499 Cardiac arrhythmia, unspecified: Secondary | ICD-10-CM | POA: Diagnosis not present

## 2018-01-06 DIAGNOSIS — I462 Cardiac arrest due to underlying cardiac condition: Secondary | ICD-10-CM | POA: Diagnosis not present

## 2018-01-07 ENCOUNTER — Ambulatory Visit: Payer: Medicare Other | Admitting: Family Medicine

## 2018-01-29 DIAGNOSIS — 419620001 Death: Secondary | SNOMED CT | POA: Diagnosis not present

## 2018-01-29 DEATH — deceased

## 2019-02-21 IMAGING — CR DG CHEST 1V PORT
1 series · 1 of 1 positions shown · non-contrast
Comparison: 07/12/2016.

CLINICAL DATA: Productive cough.  Chest pain.

EXAM:
PORTABLE CHEST 1 VIEW

[portable]
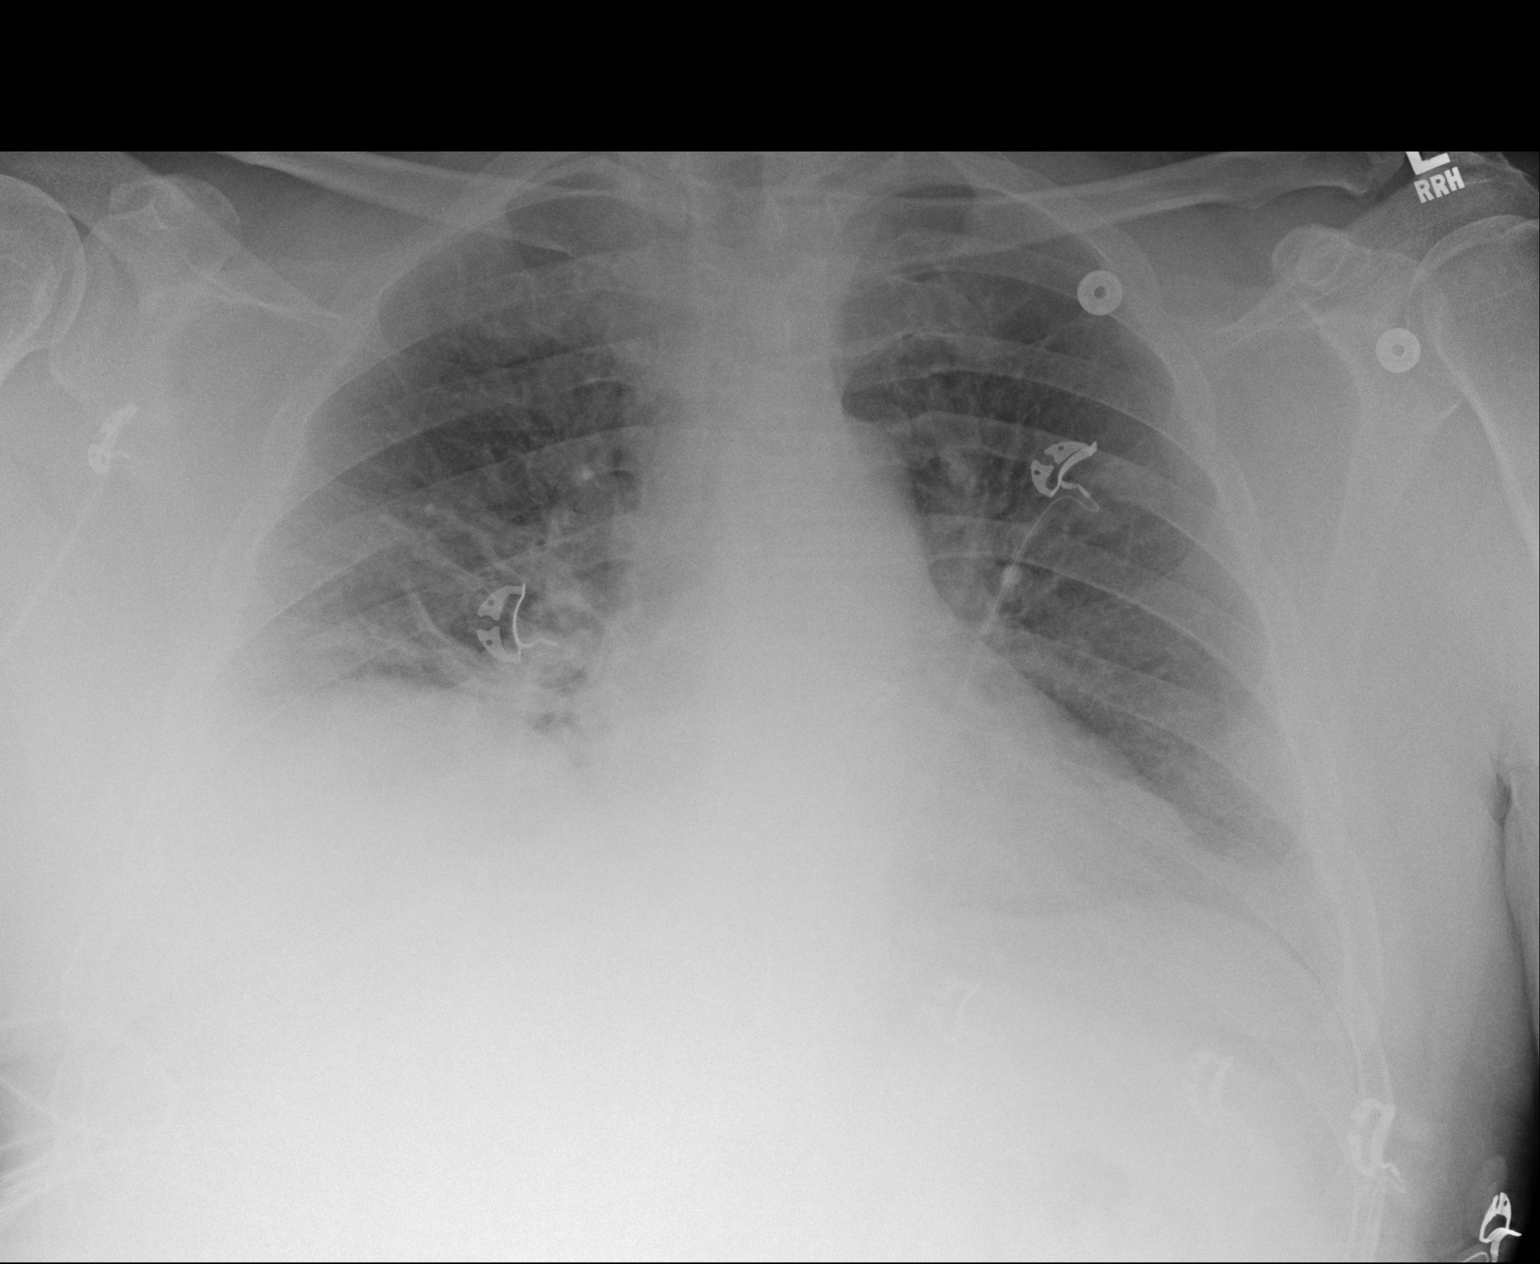

[1 of 1 positions shown; findings below may reference images not displayed]

FINDINGS: Mediastinum and hilar structures normal. Cardiomegaly with normal
pulmonary vascularity. Right base atelectasis/infiltrate. No pleural
effusion or pneumothorax.
IMPRESSION: 1.  Right base atelectasis/infiltrate.

2.  Cardiomegaly with normal pulmonary vascularity.
